# Patient Record
Sex: Female | Born: 1950
Health system: Southern US, Community
[De-identification: ages and names within clinical notes are randomized; demographics above are authoritative.]

## PROBLEM LIST (undated history)

## (undated) DIAGNOSIS — C3491 Malignant neoplasm of unspecified part of right bronchus or lung: Secondary | ICD-10-CM

## (undated) DIAGNOSIS — E559 Vitamin D deficiency, unspecified: Secondary | ICD-10-CM

## (undated) DIAGNOSIS — J449 Chronic obstructive pulmonary disease, unspecified: Secondary | ICD-10-CM

## (undated) DIAGNOSIS — R911 Solitary pulmonary nodule: Secondary | ICD-10-CM

## (undated) DIAGNOSIS — I10 Essential (primary) hypertension: Secondary | ICD-10-CM

## (undated) DIAGNOSIS — C569 Malignant neoplasm of unspecified ovary: Secondary | ICD-10-CM

## (undated) HISTORY — PX: APPENDECTOMY: SHX54

## (undated) HISTORY — DX: Vitamin D deficiency, unspecified: E55.9

## (undated) HISTORY — DX: Malignant neoplasm of unspecified ovary: C56.9

## (undated) HISTORY — DX: Solitary pulmonary nodule: R91.1

## (undated) HISTORY — PX: ABDOMINAL HYSTERECTOMY: SHX81

## (undated) HISTORY — DX: Essential (primary) hypertension: I10

## (undated) HISTORY — DX: Malignant neoplasm of unspecified part of right bronchus or lung: C34.91

## (undated) HISTORY — DX: Chronic obstructive pulmonary disease, unspecified: J44.9

---

## 1998-10-31 ENCOUNTER — Other Ambulatory Visit: Admission: RE | Admit: 1998-10-31 | Discharge: 1998-10-31 | Payer: Self-pay | Admitting: Obstetrics and Gynecology

## 1999-11-04 ENCOUNTER — Other Ambulatory Visit: Admission: RE | Admit: 1999-11-04 | Discharge: 1999-11-04 | Payer: Self-pay | Admitting: Obstetrics and Gynecology

## 2001-07-27 ENCOUNTER — Encounter: Admission: RE | Admit: 2001-07-27 | Discharge: 2001-07-27 | Payer: Self-pay | Admitting: Obstetrics and Gynecology

## 2001-07-27 ENCOUNTER — Encounter: Payer: Self-pay | Admitting: Obstetrics and Gynecology

## 2001-09-05 ENCOUNTER — Encounter: Payer: Self-pay | Admitting: Family Medicine

## 2001-09-05 ENCOUNTER — Encounter: Admission: RE | Admit: 2001-09-05 | Discharge: 2001-09-05 | Payer: Self-pay | Admitting: Family Medicine

## 2003-07-02 ENCOUNTER — Other Ambulatory Visit: Admission: RE | Admit: 2003-07-02 | Discharge: 2003-07-02 | Payer: Self-pay | Admitting: *Deleted

## 2003-08-23 ENCOUNTER — Emergency Department (HOSPITAL_COMMUNITY): Admission: EM | Admit: 2003-08-23 | Discharge: 2003-08-23 | Payer: Self-pay | Admitting: Emergency Medicine

## 2003-08-27 ENCOUNTER — Encounter: Admission: RE | Admit: 2003-08-27 | Discharge: 2003-08-27 | Payer: Self-pay | Admitting: Family Medicine

## 2003-08-28 ENCOUNTER — Encounter: Admission: RE | Admit: 2003-08-28 | Discharge: 2003-08-28 | Payer: Self-pay | Admitting: Family Medicine

## 2004-12-03 ENCOUNTER — Other Ambulatory Visit: Admission: RE | Admit: 2004-12-03 | Discharge: 2004-12-03 | Payer: Self-pay | Admitting: *Deleted

## 2004-12-23 ENCOUNTER — Encounter: Admission: RE | Admit: 2004-12-23 | Discharge: 2004-12-23 | Payer: Self-pay | Admitting: Family Medicine

## 2005-12-25 ENCOUNTER — Other Ambulatory Visit: Admission: RE | Admit: 2005-12-25 | Discharge: 2005-12-25 | Payer: Self-pay | Admitting: *Deleted

## 2006-09-07 ENCOUNTER — Encounter: Admission: RE | Admit: 2006-09-07 | Discharge: 2006-09-07 | Payer: Self-pay | Admitting: Family Medicine

## 2007-08-08 ENCOUNTER — Other Ambulatory Visit: Admission: RE | Admit: 2007-08-08 | Discharge: 2007-08-08 | Payer: Self-pay | Admitting: Family Medicine

## 2007-09-08 ENCOUNTER — Encounter: Admission: RE | Admit: 2007-09-08 | Discharge: 2007-09-08 | Payer: Self-pay | Admitting: Family Medicine

## 2008-02-17 HISTORY — PX: EXPLORATORY LAPAROTOMY: SUR591

## 2008-09-10 ENCOUNTER — Encounter: Admission: RE | Admit: 2008-09-10 | Discharge: 2008-09-10 | Payer: Self-pay | Admitting: Family Medicine

## 2008-09-16 DIAGNOSIS — C569 Malignant neoplasm of unspecified ovary: Secondary | ICD-10-CM

## 2008-09-16 HISTORY — DX: Malignant neoplasm of unspecified ovary: C56.9

## 2008-10-09 ENCOUNTER — Encounter: Admission: RE | Admit: 2008-10-09 | Discharge: 2008-10-09 | Payer: Self-pay | Admitting: Family Medicine

## 2009-10-03 ENCOUNTER — Encounter: Admission: RE | Admit: 2009-10-03 | Discharge: 2009-10-03 | Payer: Self-pay | Admitting: *Deleted

## 2009-10-17 DIAGNOSIS — J449 Chronic obstructive pulmonary disease, unspecified: Secondary | ICD-10-CM | POA: Insufficient documentation

## 2009-10-17 HISTORY — DX: Chronic obstructive pulmonary disease, unspecified: J44.9

## 2009-11-01 ENCOUNTER — Encounter: Admission: RE | Admit: 2009-11-01 | Discharge: 2009-11-01 | Payer: Self-pay | Admitting: Obstetrics and Gynecology

## 2009-11-06 ENCOUNTER — Encounter: Payer: Self-pay | Admitting: Internal Medicine

## 2009-11-20 ENCOUNTER — Encounter: Payer: Self-pay | Admitting: Internal Medicine

## 2009-11-20 DIAGNOSIS — E559 Vitamin D deficiency, unspecified: Secondary | ICD-10-CM | POA: Insufficient documentation

## 2009-11-20 DIAGNOSIS — I10 Essential (primary) hypertension: Secondary | ICD-10-CM | POA: Insufficient documentation

## 2009-11-21 ENCOUNTER — Encounter: Payer: Self-pay | Admitting: Internal Medicine

## 2009-11-21 ENCOUNTER — Ambulatory Visit: Payer: Self-pay | Admitting: Internal Medicine

## 2009-11-21 DIAGNOSIS — J984 Other disorders of lung: Secondary | ICD-10-CM | POA: Insufficient documentation

## 2010-01-29 ENCOUNTER — Ambulatory Visit: Payer: Self-pay | Admitting: Cardiology

## 2010-01-31 ENCOUNTER — Telehealth: Payer: Self-pay | Admitting: Internal Medicine

## 2010-03-03 ENCOUNTER — Ambulatory Visit: Admit: 2010-03-03 | Payer: Self-pay | Admitting: Internal Medicine

## 2010-03-06 ENCOUNTER — Ambulatory Visit
Admission: RE | Admit: 2010-03-06 | Discharge: 2010-03-06 | Payer: Self-pay | Source: Home / Self Care | Attending: Internal Medicine | Admitting: Internal Medicine

## 2010-03-18 NOTE — Assessment & Plan Note (Signed)
Summary: R CHEST LESION/ CT ///KP   Visit Type:  Initial Consult Copy to:  Dr. Seward Carol Primary Provider/Referring Provider:  Dr Lyman Bishop Nycum - OBGYN ONC [facility] [place] , Dr Joselyn Arrow - Former PMD,  Dr Lillard Anes - Former OB doc, Dr Marchelle Gearing - Pulmonary  CC:  Pulmonary Consult for abnormal CT chest.  Pt states she did have a cough earlier inthe year x 2 months. .  History of Present Illness: October  36, 5362: 60 year old female. Diagnosed in Sept 2010 with Stage 1A ovarian cancer. Doing well after s/l lap sept 2010 with tumore removal and TAH/BSO/Appy. PResented to Oncologist for surveillance CT Chest 11/01/2008.Per Dr Seward Carol records she was found to have  Incidental RUL nodule posterior segment near fissure GGO 8.13mm.C also shows some emphysema.  Presents for evaluation of same. Currently feels well - at baseline health with chronic sinus congestion. Otherwise no issues. No dyspnea. No cough.    Preventive Screening-Counseling & Management  Alcohol-Tobacco     Smoking Status: quit     Packs/Day: 0.5     Year Started: 1976     Year Quit: 2006  Current Medications (verified): 1)  Multivitamins   Tabs (Multiple Vitamin) .... Take 1 Tablet By Mouth Once A Day 2)  Fish Oil 1200 Mg Caps (Omega-3 Fatty Acids) .... Take 1 Tab By Mouth At Bedtime 3)  Vear Clock Colon Health  Caps (Probiotic Product) .... Take 1 Tab By Mouth At Bedtime 4)  Motrin Ib 200 Mg Tabs (Ibuprofen) .... As Needed 5)  Metoprolol Succinate 50 Mg Xr24h-Tab (Metoprolol Succinate) .... Take 1 Tablet By Mouth Once A Day 6)  Vitamin D 1000 Unit Tabs (Cholecalciferol) .... Take 1 Tab By Mouth At Bedtime 7)  Losartan Potassium 50 Mg Tabs (Losartan Potassium) .... Take 1 Tablet By Mouth Once A Day 8)  Vitamin C 1000 Mg Tabs (Ascorbic Acid) .... Take 1 Tablet By Mouth Once A Day 9)  Citracal Petites/vitamin D 200-250 Mg-Unit Tabs (Calcium Citrate-Vitamin D) .... Take 1 Tablet By Mouth Once A Day 10)  Zaditor  0.025 % Soln (Ketotifen Fumarate) .... One Drop in Each Eye Twice Daily  Allergies (verified): No Known Drug Allergies  Past History:  Past Medical History: HYPERTENSION (ICD-401.9)  - Was on telmisartan 2008-2011, chnaged to  losartan due to $ issus in Sept 2011  - also on lopresor * STAGE 1A OVARIAN CANCER...Marland KitchenMarland KitchenDr Seward Carol  - diagsed Sept 10, 2010 following abdominal pain and constipation. High CA 28.9. s/p TAH, BSO, Pelvic Mass, Appy  - Normal Sept 2011 per outside record VITAMIN D DEFICIENCY (ICD-268.9) # COUGH  - Jan 2011  - was severe, dry and associated post nasal drainage. 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 Started in 1969, quit in 2007 avg 7 cigs per day. Smoking Status:  quit Packs/Day:  0.5  Review of Systems       The patient complains of nasal congestion/difficulty breathing through nose.  The patient denies shortness of breath with activity, shortness of breath at rest, productive cough, non-productive cough, coughing up blood, chest pain, irregular heartbeats, acid heartburn, indigestion, loss of appetite, weight change, abdominal pain, difficulty swallowing, sore throat, tooth/dental problems, headaches, sneezing, itching, ear ache, anxiety, depression,  hand/feet swelling, joint stiffness or pain, rash, change in color of mucus, and fever.         sinus drainage - chronic spring allergies  Vital Signs:  Patient profile:   60 year old female Height:      62.5 inches Weight:      144.50 pounds BMI:     26.10 O2 Sat:      98 % on Room air Temp:     97.8 degrees F oral Pulse rate:   81 / minute BP sitting:   134 / 80  (right arm) Cuff size:   regular  Vitals Entered By: Carron Curie CMA (November 21, 2009 2:32 PM)  O2 Flow:  Room air CC: Pulmonary Consult for abnormal CT chest.  Pt states she did have a cough earlier inthe year x 2 months.  Comments Medications reviewed with patient Carron Curie CMA  November 21, 2009 2:32 PM Daytime phone number verified with patient.    Physical Exam  General:  well developed, well nourished, in no acute distress Head:  normocephalic and atraumatic Eyes:  PERRLA/EOM intact; conjunctiva and sclera clear Ears:  TMs intact and clear with normal canals Nose:  no deformity, discharge, inflammation, or lesions Mouth:  no deformity or lesions Neck:  no masses, thyromegaly, or abnormal cervical nodes Chest Wall:  no deformities noted Lungs:  clear bilaterally to auscultation and percussion Heart:  regular rate and rhythm, S1, S2 without murmurs, rubs, gallops, or clicks Abdomen:  bowel sounds positive; abdomen soft and non-tender without masses, or organomegaly Msk:  no deformity or scoliosis noted with normal posture Pulses:  pulses normal Extremities:  no clubbing, cyanosis, edema, or deformity noted Neurologic:  CN II-XII grossly intact with normal reflexes, coordination, muscle strength and tone Skin:  intact without lesions or rashes Cervical Nodes:  no significant adenopathy Axillary Nodes:  no significant adenopathy Psych:  alert and cooperative; normal mood and affect; normal attention span and concentration   CT of Chest  Procedure date:  11/01/2009  Findings:      scatterd bullous emphysema + RUL 8.77mm GGO poisterior segment  Comments:      indepently reviewed  Impression & Recommendations:  Problem # 1:  PULMONARY NODULE (ICD-518.89) Assessment New  8.54mm GGO in RUL.Low- Intermediate prob for lung cancer. But due to age, smoking hx, prior cancer and family hx of cancer needs surveillance. Instructed the rationale and told her she needs surviellance CT ches every 3-6 month for 2-3 years till it is  gone or stable. IF enlarges anytime, needs biopsy. She agrees to plan  Orders: Consultation Level V [phone]) Radiology Referral (Radiology)  Problem # 2:  C O P D (ICD-496) Assessment: New seen on CT chest but asymptomatic. Spirometry today is normal. Fev1 2.0L/83%, Ratio 81/100%.  So, Gold STAGE ZERO.   plan expectant followup  - reassured flu shot today at fu disccuss pneumovax and alpha 1 antitypsin (did not do it today, she is overwhelmed)  Medications Added to Medication List This Visit: 1)  Metoprolol Succinate 50 Mg Xr24h-tab (Metoprolol succinate) .... Take 1 tablet by mouth once a day 2)  Losartan Potassium 50 Mg Tabs (Losartan potassium) .... Take 1 tablet by mouth once a day 3)  Vitamin C 1000 Mg Tabs (Ascorbic acid) .... Take 1 tablet by mouth once a day 4)  Citracal Petites/vitamin D 200-250 Mg-unit Tabs (Calcium citrate-vitamin d) .... Take 1 tablet by mouth once a day 5)  Zaditor 0.025 %  Soln (Ketotifen fumarate) .... One drop in each eye twice daily  Other Orders: Admin 1st Vaccine (22025) Flu Vaccine 58yrs + (42706)  Patient Instructions: 1)  #PULMONARY NODULE 2)   - have followup CT chest mid dec 2011 3)  #COPD 4)   - it is only very mild and seen on CT. Your breathing test itself is normal. Currnetly we do not anticipate problems from it but you should have flu shot every year  5)  #FOLLOWUP 6)   -  mid dec 2011 after CT chest   Immunization History:  Influenza Immunization History:    Influenza:  fluvax 3+ (11/21/2007) Flu Vaccine Consent Questions     Do you have a history of severe allergic reactions to this vaccine? no    Any prior history of allergic reactions to egg and/or gelatin? no    Do you have a sensitivity to the preservative Thimersol? no    Do you have a past history of Guillan-Barre Syndrome? no    Do you currently have an acute febrile illness? no    Have you ever had a severe reaction to latex? no    Vaccine information given and  explained to patient? yes    Are you currently pregnant? no    Lot Number:[id number]   Exp Date:08/16/2010   Site Given right Deltoid IMbflu  Appended Document: R CHEST LESION/ CT ///KP fax to Dr Seward Carol and [place] [facility]  Appended Document: R CHEST LESION/ CT ///KP OV note faxed to Dr. Seward Carol and [place] .

## 2010-03-18 NOTE — Letter (Signed)
Summary: [facility]  [facility]   Imported By: Sherian Rein 11/25/2009 14:43:17  _____________________________________________________________________  External Attachment:    Type:   Image     Comment:   External Document

## 2010-03-20 NOTE — Progress Notes (Signed)
Summary: ct results  Phone Note Outgoing Call   Summary of Call: ct chest 01/29/2010 - RUL 8.55mm GGO nodule is unchanged and gave her results. Want her to come in to discuss results. At that time I will discuss options for ENB given new literature about ENB. She agrees. So, please give her appointment aftr new year. Also, please ensure [facility] gets a SUPER DIMENSON CD ROM of her CT CHEST for me. I have flaggd them Initial call taken by: Kalman Shan MD,  January 31, 2010 6:15 PM  Follow-up for Phone Call        Pt set to see MR on 03-03-10 at 4:30 for review. Carron Curie CMA  February 04, 2010 10:54 AM

## 2010-03-20 NOTE — Assessment & Plan Note (Addendum)
Summary: per pt call/mhh   Visit Type:  Follow-up Copy to:  Dr. Seward Carol Primary Provider/Referring Provider:  Dr Lyman Bishop Nycum - OBGYN ONC [facility] [place] , Dr Joselyn Arrow - Former PMD,  Dr Lillard Anes - Former OB doc, Dr Marchelle Gearing - Pulmonary  CC:  Pt here to discuss CT and next step in care. Marland Kitchen  History of Present Illness: November 29, [birth year]63: 60 year old female. Diagnosed in Sept 2010 with Stage 1A ovarian cancer. Doing well after s/l lap sept 2010 with tumore removal and TAH/BSO/Appy. PResented to Oncologist for surveillance CT Chest 11/01/2008.Per Dr Seward Carol records she was found to have  Incidental RUL nodule posterior segment near fissure GGO 8.86mm.C also shows some emphysema.  Presents for evaluation of same. Currently feels well - at baseline health with chronic sinus congestion. Otherwise no issues. No dyspnea. No cough. REC: Clinical MONITORING FOR GOLD STAGE 0 COPD AND REPEAT CT CHEST 3 MONTHS FOR PULMN NODULE   March 06, 2010: FU pulmonary nodule. No interim complaints. Asymptomatic. Had CT chest 01/29/2010 - RUL 8mm GGO is unchanged. Dr Fredirick Lathe radiologist is reocmmending  3 year followup in event this is BAC.     Preventive Screening-Counseling & Management  Alcohol-Tobacco     Smoking Status: quit     Packs/Day: 0.5     Year Started: 1976     Year Quit: 2006  Current Medications (verified): 1)  Multivitamins   Tabs (Multiple Vitamin) .... Take 1 Tablet By Mouth Once A Day 2)  Fish Oil 1200 Mg Caps (Omega-3 Fatty Acids) .... Take 1 Tab By Mouth At Bedtime 3)  Vear Clock Colon Health  Caps (Probiotic Product) .... Take 1 Tab By Mouth At Bedtime 4)  Motrin Ib 200 Mg Tabs (Ibuprofen) .... As Needed 5)  Metoprolol Succinate 50 Mg Xr24h-Tab (Metoprolol Succinate) .... Take 1 Tablet By Mouth Once A Day 6)  Vitamin D 1000 Unit Tabs (Cholecalciferol) .... Take 1 Tab By Mouth At Bedtime 7)  Losartan Potassium 50 Mg Tabs (Losartan Potassium) .... Take 1 Tablet By Mouth  Once A Day 8)  Vitamin C 1000 Mg Tabs (Ascorbic Acid) .... Take 1 Tablet By Mouth Once A Day 9)  Citracal Petites/vitamin D 200-250 Mg-Unit Tabs (Calcium Citrate-Vitamin D) .... Take 1 Tablet By Mouth Once A Day 10)  Zaditor 0.025 % Soln (Ketotifen Fumarate) .... One Drop in Each Eye Twice Daily  Allergies (verified): No Known Drug Allergies  Past History:  Past medical, surgical, family and social histories (including risk factors) reviewed, and no changes noted (except as noted below).  Past Medical History: HYPERTENSION (ICD-401.9)  - Was on telmisartan 2008-2011, chnaged to  losartan due to $ issus in Sept 2011  - also on lopresor * STAGE 1A OVARIAN CANCER...Marland KitchenMarland KitchenDr Seward Carol  - diagsed Sept 10, 2010 following abdominal pain and constipation. High CA 28.9. s/p TAH, BSO, Pelvic Mass, Appy  - Normal Sept 2011 per outside record VITAMIN D DEFICIENCY (ICD-268.9) # COUGH  - Jan 2011  - was severe, dry and associated post nasal drainage. REsolved Marc/April 2011 #CHRONIC POST NASAL DRAINAGE with SPRING ALLERGIES #RUL PULMONAR NODULE  - 8mm RUL GGO near fissure posteriorly   - no chnage in 3 months mid dec 2011    Past Surgical History: Reviewed history from 11/20/2009 and no changes required. Exploratory Laparotomy TAH/BSO and cancer staging  Family History: Reviewed history from 11/21/2009 and no changes required. Multiple Myeloma-sister deceased at age 72 Father-died suddenly age  71  Social History: Reviewed history from 11/21/2009 and no changes required. 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 Started in 1969, quit in 2007 avg 7 cigs per day.  Review of Systems  The patient denies shortness of breath with activity, shortness of breath at rest, productive cough, non-productive cough, coughing up blood, chest pain, irregular heartbeats, acid heartburn, indigestion, loss of appetite,  weight change, abdominal pain, difficulty swallowing, sore throat, tooth/dental problems, headaches, nasal congestion/difficulty breathing through nose, sneezing, itching, ear ache, anxiety, depression, hand/feet swelling, joint stiffness or pain, rash, change in color of mucus, and fever.    Vital Signs:  Patient profile:   60 year old female Height:      62.5 inches Weight:      146.38 pounds BMI:     26.44 O2 Sat:      97 % on Room air Temp:     98.1 degrees F oral Pulse rate:   62 / minute BP sitting:   120 / 84  (right arm) Cuff size:   regular  Vitals Entered By: Carron Curie CMA (March 06, 2010 3:21 PM)  O2 Flow:  Room air CC: Pt here to discuss CT and next step in care.  Comments Medications reviewed with patient Carron Curie CMA  March 06, 2010 3:23 PM Daytime phone number verified with patient.    Physical Exam  General:  well developed, well nourished, in no acute distress Head:  normocephalic and atraumatic Eyes:  PERRLA/EOM intact; conjunctiva and sclera clear Ears:  TMs intact and clear with normal canals Nose:  no deformity, discharge, inflammation, or lesions Mouth:  no deformity or lesions Neck:  no masses, thyromegaly, or abnormal cervical nodes Chest Wall:  no deformities noted Lungs:  clear bilaterally to auscultation and percussion Heart:  regular rate and rhythm, S1, S2 without murmurs, rubs, gallops, or clicks Abdomen:  bowel sounds positive; abdomen soft and non-tender without masses, or organomegaly Msk:  no deformity or scoliosis noted with normal posture Pulses:  pulses normal Extremities:  no clubbing, cyanosis, edema, or deformity noted Neurologic:  CN II-XII grossly intact with normal reflexes, coordination, muscle strength and tone Skin:  intact without lesions or rashes Cervical Nodes:  no significant adenopathy Axillary Nodes:  no significant adenopathy Psych:  alert and cooperative; normal mood and affect; normal attention  span and concentration   CT of Chest  Procedure date:  01/29/2010  Findings:      MPRESSION:  Right upper lobe ground-glass nodule appears stable from the 11/01/2009.  Continued annual follow-up, for at least 3 years, is recommended. This recommendation is taken from the following article:  Subsolid Pulmonary Nodules and the Spectrum of Peripheral Adenocarcinomas of the Lung:  Recommended Interim Guidelines for Assessment and Management.  Radiology December 2009; [phone].  Read By:  Reyes Ivan.,  M.D.     Released By:  Reyes Ivan.,  M.D.  Comments:      independely rreviewed  Impression & Recommendations:  Problem # 1:  PULMONARY NODULE (ICD-518.89) Assessment Unchanged  8.37mm GGO in RUL.Low- Intermediate prob for lung cancer. No change in 3 months mid-dec 2011. We again discussed probability for cancer and she agrees that she cannot miss followup. We discussed presence of new technology ENB Bx. She is interested in it (low risk profile for complications) if Dr. Delton Coombes feels confident that there is an airway leading to it on the SUPER  D CT. SHe understands if bx is positive for cancer then it is lobectomy but if non-diagnostic she needs serial followup but perhaps a bit more reassured that odds of malignancy wil be low.  I will get back to her afer I get Dr. Delton Coombes to reviw SUPER D CT  Orders: Consultation Level V [phone]) Radiology Referral (Radiology)  Orders: Est. Patient Level III (60454)  Patient Instructions: 1)  I am going to show your CT scan to my colleague Dr Delton Coombes and get back to you  Appended Document: per pt call/mhh DR Delton Coombes told me few weeks ago that he cannot reach that nodule with ENB. So, she needs repeat CT chest wihtout contrast in 3 months and followup. Please set this up. I called her to communicate this and gave her the result . She is ok with plan./

## 2010-04-25 ENCOUNTER — Telehealth: Payer: Self-pay | Admitting: Internal Medicine

## 2010-04-28 ENCOUNTER — Other Ambulatory Visit: Payer: Self-pay | Admitting: Internal Medicine

## 2010-04-28 DIAGNOSIS — R911 Solitary pulmonary nodule: Secondary | ICD-10-CM

## 2010-05-01 ENCOUNTER — Ambulatory Visit (INDEPENDENT_AMBULATORY_CARE_PROVIDER_SITE_OTHER)
Admission: RE | Admit: 2010-05-01 | Discharge: 2010-05-01 | Disposition: A | Discharge status: Home / Self Care | Payer: BC Managed Care – PPO | Source: Ambulatory Visit | Attending: Internal Medicine | Admitting: Internal Medicine

## 2010-05-01 DIAGNOSIS — J984 Other disorders of lung: Secondary | ICD-10-CM

## 2010-05-01 DIAGNOSIS — R911 Solitary pulmonary nodule: Secondary | ICD-10-CM

## 2010-05-05 ENCOUNTER — Telehealth: Payer: Self-pay | Admitting: Internal Medicine

## 2010-05-06 ENCOUNTER — Other Ambulatory Visit: Payer: Self-pay | Admitting: Internal Medicine

## 2010-05-06 DIAGNOSIS — R911 Solitary pulmonary nodule: Secondary | ICD-10-CM

## 2010-05-06 NOTE — Progress Notes (Signed)
Summary: needs serial CT and opd fu  Phone Note Outgoing Call   Summary of Call: DR Delton Coombes told me few weeks ago that he cannot reach that nodule with ENB. So, she needs repeat CT chest wihtout contrast in 3 months and followup. Please set this up. I called her to communicate this and gave her the result . She is ok with plan. CT order placed. Giver her appt for ct and fu with me Initial call taken by: Kalman Shan MD,  April 25, 2010 4:41 PM  Follow-up for Phone Call        pt set to see MR on 05-12-10 at 2:45. Carron Curie CMA  April 28, 2010 5:05 PM

## 2010-05-07 ENCOUNTER — Encounter: Payer: Self-pay | Admitting: Internal Medicine

## 2010-05-12 ENCOUNTER — Ambulatory Visit (INDEPENDENT_AMBULATORY_CARE_PROVIDER_SITE_OTHER): Payer: BC Managed Care – PPO | Admitting: Internal Medicine

## 2010-05-12 ENCOUNTER — Encounter: Payer: Self-pay | Admitting: Internal Medicine

## 2010-05-12 VITALS — BP 96/70 | HR 62 | Temp 98.7°F | Ht 62.0 in | Wt 148.2 lb

## 2010-05-12 DIAGNOSIS — J449 Chronic obstructive pulmonary disease, unspecified: Secondary | ICD-10-CM

## 2010-05-12 DIAGNOSIS — J984 Other disorders of lung: Secondary | ICD-10-CM

## 2010-05-12 DIAGNOSIS — R911 Solitary pulmonary nodule: Secondary | ICD-10-CM

## 2010-05-12 NOTE — Assessment & Plan Note (Signed)
  RUL 8-70mm GGO noted Sept 2011 Stable at 6 month CT chest March 2012. Asymptomatic   PLAN Next ct chest in sept 2012 to complete 1 year followup She will call for results. Plan is to manage her over phone unless there are symptoms are growth and have her come in

## 2010-05-12 NOTE — Progress Notes (Signed)
  Subjective:    Patient ID: Kristy Morrow, female    DOB: [birth year], 60 y.o.   MRN: [id number]  HPI Followup RUL pulmonary nodule. Gold stage 0 COPD and ex-smoker. No interim complaints since last visits. Feels fine. Asymptomatic. Not relapsed into smoking. Had CT chest 05/01/2010 - personally reviewed - no change in RUL nodule.   Review of Systems   Constitutional:   No  weight loss, night sweats,  Fevers, chills, fatigue, lassitude. HEENT:   No headaches,  Difficulty swallowing,  Tooth/dental problems,  Sore throat,                No sneezing, itching, ear ache, nasal congestion, post nasal drip,   CV:  No chest pain,  Orthopnea, PND, swelling in lower extremities, anasarca, dizziness, palpitations  GI  No heartburn, indigestion, abdominal pain, nausea, vomiting, diarrhea, change in bowel habits, loss of appetite  Resp: No shortness of breath with exertion or at rest.  No excess mucus, no productive cough,  No non-productive cough,  No coughing up of blood.  No change in color of mucus.  No wheezing.  No chest wall deformity  Skin: no rash or lesions.  GU: no dysuria, change in color of urine, no urgency or frequency.  No flank pain.  MS:  No joint pain or swelling.  No decreased range of motion.  No back pain.  Psych:  No change in mood or affect. No depression or anxiety.  No memory loss.    Objective:   Physical Exam  Vitals reviewed. Constitutional: She is oriented to person, place, and time. She appears well-developed and well-nourished. No distress.  HENT:  Head: Normocephalic and atraumatic.  Right Ear: External ear normal.  Left Ear: External ear normal.  Mouth/Throat: Oropharynx is clear and moist. No oropharyngeal exudate.  Eyes: Conjunctivae and EOM are normal. Pupils are equal, round, and reactive to light. Right eye exhibits no discharge. Left eye exhibits no discharge. No scleral icterus.  Neck: Normal range of motion. Neck supple. No JVD present. No tracheal  deviation present. No thyromegaly present.  Cardiovascular: Normal rate, regular rhythm, normal heart sounds and intact distal pulses.  Exam reveals no gallop and no friction rub.   No murmur heard. Pulmonary/Chest: Effort normal and breath sounds normal. No respiratory distress. She has no wheezes. She has no rales. She exhibits no tenderness.  Abdominal: Soft. Bowel sounds are normal. She exhibits no distension and no mass. There is no tenderness. There is no rebound and no guarding.  Musculoskeletal: Normal range of motion. She exhibits no edema and no tenderness.  Lymphadenopathy:    She has no cervical adenopathy.  Neurological: She is alert and oriented to person, place, and time. She has normal reflexes. No cranial nerve deficit. She exhibits normal muscle tone. Coordination normal.  Skin: Skin is warm and dry. No rash noted. She is not diaphoretic. No erythema. No pallor.  Psychiatric: She has a normal mood and affect. Her behavior is normal. Judgment and thought content normal.           Assessment & Plan:

## 2010-05-12 NOTE — Assessment & Plan Note (Signed)
Plan Check alpha 1 at some point in future

## 2010-05-12 NOTE — Patient Instructions (Signed)
Glad your lung nodule Right upper part is stable now at 6 months NExt CT chest in sept 2012 After that CT chest, please call us for result - if you are fine at that time without complaints we can discuss results and followup over phone without having to come in

## 2010-05-20 NOTE — Progress Notes (Signed)
Summary: ct chest results and fu  Phone Note Outgoing Call   Summary of Call: ct shows stable nodule in 6 months. IF she wants to come in to discuss she can do so on 05/12/2010. Otherwise, repeat CT chest wihtout contrast 9 months but definitely see me then. ORder placed Initial call taken by: Kalman Shan MD,  May 05, 2010 9:41 PM  Follow-up for Phone Call        pt cam e for appt on 05-12-10.Carron Curie CMA  May 12, 2010 2:50 PM

## 2010-11-05 ENCOUNTER — Other Ambulatory Visit: Payer: BC Managed Care – PPO

## 2010-11-07 ENCOUNTER — Other Ambulatory Visit: Payer: Self-pay | Admitting: Family Medicine

## 2010-11-07 DIAGNOSIS — Z1231 Encounter for screening mammogram for malignant neoplasm of breast: Secondary | ICD-10-CM

## 2010-11-10 ENCOUNTER — Ambulatory Visit
Admission: RE | Admit: 2010-11-10 | Discharge: 2010-11-10 | Disposition: A | Discharge status: Home / Self Care | Payer: BC Managed Care – PPO | Source: Ambulatory Visit | Attending: Family Medicine | Admitting: Family Medicine

## 2010-11-10 DIAGNOSIS — Z1231 Encounter for screening mammogram for malignant neoplasm of breast: Secondary | ICD-10-CM

## 2010-11-11 ENCOUNTER — Other Ambulatory Visit: Payer: Self-pay | Admitting: Obstetrics and Gynecology

## 2010-11-11 DIAGNOSIS — C569 Malignant neoplasm of unspecified ovary: Secondary | ICD-10-CM

## 2010-12-02 ENCOUNTER — Ambulatory Visit
Admission: RE | Admit: 2010-12-02 | Discharge: 2010-12-02 | Disposition: A | Discharge status: Home / Self Care | Payer: BC Managed Care – PPO | Source: Ambulatory Visit | Attending: Obstetrics and Gynecology | Admitting: Obstetrics and Gynecology

## 2010-12-02 DIAGNOSIS — C569 Malignant neoplasm of unspecified ovary: Secondary | ICD-10-CM

## 2010-12-02 MED ORDER — IOHEXOL 300 MG/ML  SOLN: 100.0000 mL | Freq: Once | INTRAMUSCULAR | Status: AC | PRN

## 2010-12-08 ENCOUNTER — Telehealth: Payer: Self-pay | Admitting: Internal Medicine

## 2010-12-08 NOTE — Telephone Encounter (Signed)
LMOM for pt TCB to get more info.   

## 2010-12-08 NOTE — Telephone Encounter (Signed)
Pt returned call.  States she has a CT done on 12/02/10 at [facility].  States she was told to call for the results by MR at last OV.  MR, pls advise.  Thanks!

## 2010-12-11 NOTE — Telephone Encounter (Signed)
Called and spoke with pt.  Pt aware of CT results and MR's response.Pt verbalized understanding and denied any questions.    Pt will call back in approx 5 months to schedule her 6 month CT scan and also OV with MR.

## 2010-12-11 NOTE — Telephone Encounter (Signed)
Ct chest 12/02/10: unchanged from march 2012. So no change in 1 year since sept 2011. Need another CT chest without contrast in 6 months. But at that time I should see her. She also had abd ct 12/02/10 - not sure if I ordered it but they are reporting that is fine too

## 2010-12-11 NOTE — Telephone Encounter (Signed)
Please advise. Thanks.  

## 2011-12-03 ENCOUNTER — Other Ambulatory Visit: Payer: Self-pay | Admitting: Family Medicine

## 2011-12-03 DIAGNOSIS — Z1231 Encounter for screening mammogram for malignant neoplasm of breast: Secondary | ICD-10-CM

## 2012-01-05 ENCOUNTER — Telehealth: Payer: Self-pay | Admitting: Internal Medicine

## 2012-01-05 ENCOUNTER — Other Ambulatory Visit: Payer: Self-pay | Admitting: Internal Medicine

## 2012-01-05 DIAGNOSIS — R911 Solitary pulmonary nodule: Secondary | ICD-10-CM

## 2012-01-05 NOTE — Telephone Encounter (Signed)
Pt states she wants the CT chest ordered but could not schedule an appt at this time because she was driving. She requested that we call her tomorrow and schedule both appts. Order placed. Carron Curie, CMA

## 2012-01-05 NOTE — Telephone Encounter (Signed)
She did not followup in spring of this year for 6 month CT chest from oct 2012. She needs to have a CT now which will be 1 year. Pleae check with her wihat is going on

## 2012-01-06 ENCOUNTER — Ambulatory Visit
Admission: RE | Admit: 2012-01-06 | Discharge: 2012-01-06 | Disposition: A | Discharge status: Home / Self Care | Payer: BC Managed Care – PPO | Source: Ambulatory Visit | Attending: Family Medicine | Admitting: Family Medicine

## 2012-01-06 DIAGNOSIS — Z1231 Encounter for screening mammogram for malignant neoplasm of breast: Secondary | ICD-10-CM

## 2012-01-08 ENCOUNTER — Ambulatory Visit (INDEPENDENT_AMBULATORY_CARE_PROVIDER_SITE_OTHER)
Admission: RE | Admit: 2012-01-08 | Discharge: 2012-01-08 | Disposition: A | Discharge status: Home / Self Care | Payer: BC Managed Care – PPO | Source: Ambulatory Visit | Attending: Internal Medicine | Admitting: Internal Medicine

## 2012-01-08 DIAGNOSIS — R911 Solitary pulmonary nodule: Secondary | ICD-10-CM

## 2012-01-11 ENCOUNTER — Telehealth: Payer: Self-pay | Admitting: Internal Medicine

## 2012-01-11 NOTE — Telephone Encounter (Signed)
Called gave ct results. expalined nodule is more prominent but unchangd in size. Likely approach is serial monitoring. Will discuss other options at fu 02/03/12

## 2012-02-03 ENCOUNTER — Telehealth: Payer: Self-pay | Admitting: Internal Medicine

## 2012-02-03 ENCOUNTER — Encounter: Payer: Self-pay | Admitting: Internal Medicine

## 2012-02-03 ENCOUNTER — Ambulatory Visit (INDEPENDENT_AMBULATORY_CARE_PROVIDER_SITE_OTHER): Payer: BC Managed Care – PPO | Admitting: Internal Medicine

## 2012-02-03 VITALS — BP 124/84 | HR 59 | Temp 98.0°F | Wt 151.0 lb

## 2012-02-03 DIAGNOSIS — R911 Solitary pulmonary nodule: Secondary | ICD-10-CM

## 2012-02-03 DIAGNOSIS — J984 Other disorders of lung: Secondary | ICD-10-CM

## 2012-02-03 NOTE — Assessment & Plan Note (Signed)
RUL 8-63mm GGO noted Sept 2011 Stable at 6 month CT chest March 2012. Asymptomatic. Not a candidate for ENB - no airway leading up to it Stable 12 month Oct 2012 Unchanged in size but more prominent Nov 2013 - 26th month CT  Because nodule is more prominent in appearance thought unchanged in size at 2 year mark, we agreed she needs continue surveillance. WIll repeat ct chest in 9-10 months. We diuscussed getting PET scan now or doing XPRESYS blood test but she decided against it  > 50% of this 15 min visit spent in face to face counseling

## 2012-02-03 NOTE — Progress Notes (Signed)
  Subjective:    Patient ID: Kristy Morrow, female    DOB: 1950-02-26, 61 y.o.   MRN: [id number]  HPI Followup RUL pulmonary nodule. Gold stage 0 COPD and ex-smoker. No interim complaints since last visits. Feels fine. Asymptomatic. Not relapsed into smoking. Had CT chest 05/01/2010 - personally reviewed - no change in RUL nodule.  REC Glad your lung nodule Right upper part is stable now at 6 months  NExt CT chest in sept 2012  After that CT chest, please call us for result - if you are fine at that time without complaints we can discuss results and followup over phone without having to come in   OV 02/03/2012  FU CT chest for rUL nodule 1cm  -Last CT ws OCt 2012 and was stable compared to sept 2011. She then missed appt and we had to call her and have her do 2 year nov 2013 ct chest reviewed below which shows nodule as more "prominent" compared to 2 years ago but unchanged in size. She has no interim complaints. Feels well  CT chest nov 2013IMPRESSION:  1. Numerous previously noted subsolid nodules in the lungs appear  generally similar to prior examinations, as above. The exception is  a 11 x 7 mm subsolid nodule in the posterior aspect of the right  upper lobe (image 18 of series 3) more prominent since sept 2011. Continued attention to this  lesion (and the others) on a 1-year follow-up CT in November 2014  is recommended.      Review of Systems  Constitutional: Negative for fever and unexpected weight change.  HENT: Negative for ear pain, nosebleeds, congestion, sore throat, rhinorrhea, sneezing, trouble swallowing, dental problem, postnasal drip and sinus pressure.   Eyes: Negative for redness and itching.  Respiratory: Negative for cough, chest tightness, shortness of breath and wheezing.   Cardiovascular: Negative for palpitations and leg swelling.  Gastrointestinal: Negative for nausea and vomiting.  Genitourinary: Negative for dysuria.  Musculoskeletal: Negative for joint  swelling.  Skin: Negative for rash.  Neurological: Negative for headaches.  Hematological: Does not bruise/bleed easily.  Psychiatric/Behavioral: Negative for dysphoric mood. The patient is not nervous/anxious.    Past, Family, Social reviewed: no change since last visit     Objective:   Physical Exam Filed Vitals:   02/03/12 1449  BP: 124/84  Pulse: 59  Temp: 98 F (36.7 C)  TempSrc: Oral  Weight: 151 lb (68.493 kg)  SpO2: 100%     Constitutional: She is oriented to person, place, and time. She appears well-developed and well-nourished. No distress.  Neurological: She is alert and oriented to person, place, and time. She has normal reflexes. No cranial nerve deficit. She exhibits normal muscle tone. Coordination normal.   Psychiatric: She has a normal mood and affect. Her behavior is normal. Judgment and thought content normal.           Assessment & Plan:

## 2012-02-03 NOTE — Telephone Encounter (Signed)
Kristy Morrow  On instructions is her OB doc name & fax. Please fax my office note to him  Thanks  MR

## 2012-02-03 NOTE — Patient Instructions (Addendum)
#  Lung nodule  - stable in size sept 2011 through nov 2013 but nov 2013 shows slight more "prominence" - unclear what this means but needs closer followup  - please have repeat CT chest without contrast in 9-10 months from now and return for followup   - I wil send copy of note and CT to Dr Elita Boone, [address] [place], [place] [address]. Fax [phone], phone [phone]  [facility]

## 2012-02-05 NOTE — Telephone Encounter (Signed)
I will fax to [phone] on Monday. Carron Curie, CMA

## 2012-02-09 NOTE — Telephone Encounter (Signed)
Note faxed. Gadge Hermiz, CMA  

## 2012-04-11 ENCOUNTER — Other Ambulatory Visit: Payer: Self-pay | Admitting: Family Medicine

## 2012-04-11 DIAGNOSIS — M8580 Other specified disorders of bone density and structure, unspecified site: Secondary | ICD-10-CM

## 2012-04-11 DIAGNOSIS — E2839 Other primary ovarian failure: Secondary | ICD-10-CM

## 2012-04-11 DIAGNOSIS — Z1231 Encounter for screening mammogram for malignant neoplasm of breast: Secondary | ICD-10-CM

## 2012-06-08 ENCOUNTER — Other Ambulatory Visit: Payer: Self-pay | Admitting: Obstetrics and Gynecology

## 2012-06-08 DIAGNOSIS — C569 Malignant neoplasm of unspecified ovary: Secondary | ICD-10-CM

## 2012-06-10 ENCOUNTER — Other Ambulatory Visit: Payer: BC Managed Care – PPO

## 2012-06-14 ENCOUNTER — Ambulatory Visit
Admission: RE | Admit: 2012-06-14 | Discharge: 2012-06-14 | Disposition: A | Discharge status: Home / Self Care | Payer: BC Managed Care – PPO | Source: Ambulatory Visit | Attending: Obstetrics and Gynecology | Admitting: Obstetrics and Gynecology

## 2012-06-14 DIAGNOSIS — C569 Malignant neoplasm of unspecified ovary: Secondary | ICD-10-CM

## 2012-06-14 MED ORDER — IOHEXOL 300 MG/ML  SOLN
100.0000 mL | Freq: Once | INTRAMUSCULAR | Status: AC | PRN
  Administered 2012-06-14: 100 mL via INTRAVENOUS

## 2012-07-10 ENCOUNTER — Telehealth: Payer: Self-pay | Admitting: Internal Medicine

## 2012-07-10 NOTE — Telephone Encounter (Signed)
Please ensure she has CT chest wo contrast for lung nodule around sept/oct 2014   Thanks  Dr. Kalman Shan, M.D., [facility].C.P Pulmonary and Critical Care Medicine Staff Physician [facility] [place] [facility] Pager: [phone], If no answer or between  15:00h - 7:00h: call [phone]  07/10/2012 1:46 AM

## 2012-07-21 NOTE — Telephone Encounter (Signed)
Order is placed for CT in sept/oct 2014. Carron Curie, CMA

## 2012-12-15 ENCOUNTER — Ambulatory Visit (INDEPENDENT_AMBULATORY_CARE_PROVIDER_SITE_OTHER)
Admission: RE | Admit: 2012-12-15 | Discharge: 2012-12-15 | Disposition: A | Discharge status: Home / Self Care | Payer: BC Managed Care – PPO | Source: Ambulatory Visit | Attending: Internal Medicine | Admitting: Internal Medicine

## 2012-12-15 DIAGNOSIS — R911 Solitary pulmonary nodule: Secondary | ICD-10-CM

## 2012-12-16 ENCOUNTER — Telehealth: Payer: Self-pay | Admitting: Internal Medicine

## 2012-12-16 DIAGNOSIS — J984 Other disorders of lung: Secondary | ICD-10-CM

## 2012-12-16 NOTE — Telephone Encounter (Signed)
  Sending to triage because this needs attention today regarding Chest CT report (cc To Dr Delton Coombes)  1) LEt jher know that compared to a year ago that RUL nodule is slightly larger to same size but seems definitely denser. In other words, it is looking different than it did a year ago. We need to discuss a) what this means; b) how to proceed with workup ?  So,   A) please call Rose at [facility] CT today and make sure SUPER D CD Rom is made ASAP  B) Due to my absence, let her know that you will give appt to see Dr Delton Coombes who is our nodule expert to guide her through the process. Please give appt to see him asa; ensure he has the super D CT  Thanks  Dr. Kalman Shan, M.D., [facility].C.P Pulmonary and Critical Care Medicine Staff Physician [facility] [place] [facility]monary and Critical [facility] Pager: [phone], If no answer or between  15:00h - 7:00h: call [phone]  12/16/2012 3:35 PM      Ct Chest Wo Contrast  12/15/2012   CLINICAL DATA:  Routine followup pulmonary nodule. No new symptoms.  EXAM: CT CHEST WITHOUT CONTRAST  TECHNIQUE: Multidetector CT imaging of the chest was performed following the standard protocol without IV contrast.  COMPARISON:  01/08/2012  FINDINGS: The sub solid nodule is noted in the posterior right upper lobe abutting the major fissure appears larger and more dense on today's study. Best estimation of size is approximately 1.5 x 1.3 cm on image 16 of series 3. On the soft tissue windows, there does appear to be a solid component measuring approximately 5-6 mm. Given the change in the appearance of this nodule, would recommend biopsy or surgical resection.  4 mm ground-glass area within the right apex is stable. Ill-defined ground-glass density in the left upper lobe on image 14 measures approximately 11 mm. When measured at the same level in the same planes, this is stable. No new suspicious areas. No pleural effusions.  No mediastinal, hilar, or axillary  adenopathy. Heart is normal size. Aorta is normal caliber. Chest wall soft tissues are unremarkable. Imaging into the upper abdomen shows no acute findings. No acute bony abnormality. Small sclerotic focus within a lower thoracic vertebral body is stable, likely small bone island.  IMPRESSION: Change in the appearance of the previously seen sub solid ground-glass nodule in the posterior right upper lobe. This has enlarged slightly in size and appears denser. There is approximately 5-6 mm solid component centrally seen on soft tissue windows. Given this change and the small solid components, recommend biopsy or surgical resection. With this appearance, PET CT would likely be equivocal.   Electronically Signed   By: Charlett Nose M.D.   On: 12/15/2012 16:11

## 2012-12-16 NOTE — Telephone Encounter (Signed)
Pt scheduled for 12/10 appt with Byrum--consult pulm nodule PET to be scheduled before appt.  Order placed.   Will send to [facility] to follow up on so that RB nurse is aware that PET gets done in time for appt. Thanks.

## 2012-12-16 NOTE — Telephone Encounter (Signed)
Spoke with Rose--CT SUPER D requested--requested to be delivered to [facility] Pt aware of results and recs per MR.  Patient to see Dr Delton Coombes (per MR)-- pt offered appt for Monday 12/19/12 at 4:15/4:30-- pt refused d/t being out of town, does not get home until 12/20/12 Pt states that she will only be in town until 12/30/12, then going out of town again x 10days  Pt states she needs appt btw 11/10-11/14-- no available slots for either Byrum or Ramaswamy in that time period.  Patient would like to know if this is something that you feel could wait until the last week of this month or first week of December.  Please advise MR. thanks.

## 2012-12-19 NOTE — Telephone Encounter (Signed)
Pet scheduled 01/17/13 Tobe Sos

## 2013-01-10 ENCOUNTER — Ambulatory Visit: Payer: BC Managed Care – PPO

## 2013-01-10 ENCOUNTER — Ambulatory Visit
Admission: RE | Admit: 2013-01-10 | Discharge: 2013-01-10 | Disposition: A | Discharge status: Home / Self Care | Payer: BC Managed Care – PPO | Source: Ambulatory Visit | Attending: Family Medicine | Admitting: Family Medicine

## 2013-01-10 DIAGNOSIS — Z1231 Encounter for screening mammogram for malignant neoplasm of breast: Secondary | ICD-10-CM

## 2013-01-10 DIAGNOSIS — E2839 Other primary ovarian failure: Secondary | ICD-10-CM

## 2013-01-17 ENCOUNTER — Ambulatory Visit (HOSPITAL_COMMUNITY)
Admission: RE | Admit: 2013-01-17 | Discharge: 2013-01-17 | Disposition: A | Discharge status: Home / Self Care | Payer: BC Managed Care – PPO | Source: Ambulatory Visit | Attending: Internal Medicine | Admitting: Internal Medicine

## 2013-01-17 DIAGNOSIS — J984 Other disorders of lung: Secondary | ICD-10-CM

## 2013-01-17 DIAGNOSIS — R911 Solitary pulmonary nodule: Secondary | ICD-10-CM | POA: Insufficient documentation

## 2013-01-17 LAB — GLUCOSE, CAPILLARY: Glucose-Capillary: 98 mg/dL (ref 70–99)

## 2013-01-17 MED ORDER — FLUDEOXYGLUCOSE F - 18 (FDG) INJECTION
17.6000 | Freq: Once | INTRAVENOUS | Status: AC | PRN
  Administered 2013-01-17: 17.6 via INTRAVENOUS

## 2013-01-19 ENCOUNTER — Telehealth: Payer: Self-pay | Admitting: *Deleted

## 2013-01-19 ENCOUNTER — Encounter: Payer: Self-pay | Admitting: Emergency Medicine

## 2013-01-19 NOTE — Telephone Encounter (Signed)
I have the superD disk, will hold onto it.

## 2013-01-19 NOTE — Telephone Encounter (Signed)
Message copied by Darrell Jewel on Thu Jan 19, 2013 12:04 PM ------      Message from: Kalman Shan      Created: Tue Jan 17, 2013  4:47 PM       byrum to discuss result 01/25/13. ENsure there is Super D from prior CT. I Think we got one. IF there is, give to [facility] before or on 01/25/13 ------

## 2013-01-19 NOTE — Telephone Encounter (Signed)
Jennifer,  Check with ROb Byrum, I have copied him on this. Might have lefte earlier tepehone message that super D should go directly to him  Thanks  Dr. Kalman Shan, M.D., [facility].C.P Pulmonary and Critical Care Medicine Staff Physician [facility] [place] Pulmonary and Critical Care Pager: [phone], If no answer or between  15:00h - 7:00h: call [phone]  01/19/2013 3:18 PM

## 2013-01-19 NOTE — Telephone Encounter (Signed)
I called rose and they did a super D ct on 12-16-12 and  Sent it over. I looked through your office and I cannot find it. Do you know where it would be? Carron Curie, CMA

## 2013-01-25 ENCOUNTER — Encounter (INDEPENDENT_AMBULATORY_CARE_PROVIDER_SITE_OTHER): Payer: Self-pay

## 2013-01-25 ENCOUNTER — Encounter: Payer: Self-pay | Admitting: Emergency Medicine

## 2013-01-25 ENCOUNTER — Ambulatory Visit (INDEPENDENT_AMBULATORY_CARE_PROVIDER_SITE_OTHER): Payer: BC Managed Care – PPO | Admitting: Emergency Medicine

## 2013-01-25 VITALS — BP 130/78 | HR 80 | Ht 62.0 in | Wt 145.5 lb

## 2013-01-25 DIAGNOSIS — J984 Other disorders of lung: Secondary | ICD-10-CM

## 2013-01-25 NOTE — Assessment & Plan Note (Signed)
Given the size and shape of nodule, its growth, and PET negative for mets, I believe that she is a good candidate for primary resection. I have recommended this and will refer her to TCTS to discuss the procedure. If she has hesitation she can always come back to me to discuss possible FOB + ENB.

## 2013-01-25 NOTE — Progress Notes (Signed)
   Subjective:    Patient ID: Kristy Morrow, female    DOB: 06-16-1950, 62 y.o.   MRN: [id number]  HPI 62 yo woman with hx of ovarian CA,  mild COPD, RUL nodule that has been followed. The nodule was increased in size by CT scan 12/15/12, was not PET hot on scan from 01/19/13 but is small and may not meet size sensitivity criteria.    Review of Systems  Constitutional: Negative for fever and unexpected weight change.  HENT: Positive for sinus pressure and sneezing. Negative for congestion, dental problem, ear pain, nosebleeds, postnasal drip, rhinorrhea, sore throat and trouble swallowing.   Eyes: Negative for redness and itching.  Respiratory: Negative for cough, chest tightness, shortness of breath and wheezing.   Cardiovascular: Negative for palpitations and leg swelling.  Gastrointestinal: Negative for nausea and vomiting.  Genitourinary: Negative for dysuria.  Musculoskeletal: Negative for joint swelling.  Skin: Negative for rash.  Neurological: Negative for headaches.  Hematological: Does not bruise/bleed easily.  Psychiatric/Behavioral: Negative for dysphoric mood. The patient is not nervous/anxious.        Objective:   Physical Exam Filed Vitals:   01/25/13 1057  BP: 130/78  Pulse: 80  Height: 5\' 2"  (1.575 m)  Weight: 145 lb 8 oz (65.998 kg)  SpO2: 100%   Gen: Pleasant, well-nourished, in no distress,  normal affect  ENT: No lesions,  mouth clear,  oropharynx clear, no postnasal drip  Neck: No JVD, no TMG, no carotid bruits  Lungs: No use of accessory muscles, clear without rales or rhonchi  Cardiovascular: RRR, heart sounds normal, no murmur or gallops, no peripheral edema  Musculoskeletal: No deformities, no cyanosis or clubbing  Neuro: alert, non focal  Skin: Warm, no lesions or rashes     Assessment & Plan:  PULMONARY NODULE Given the size and shape of nodule, its growth, and PET negative for mets, I believe that she is a good candidate for primary  resection. I have recommended this and will refer her to TCTS to discuss the procedure. If she has hesitation she can always come back to me to discuss possible FOB + ENB.

## 2013-01-25 NOTE — Patient Instructions (Signed)
We will refer you to the thoracic surgery to discuss possible resection of your right upper lobe nodule Depending on these discussions we could still consider biopsy by bronchoscopy if this is the preferred option Follow with Dr. Delton Coombes or Marchelle Gearing as needed

## 2013-01-31 ENCOUNTER — Ambulatory Visit (INDEPENDENT_AMBULATORY_CARE_PROVIDER_SITE_OTHER): Payer: BC Managed Care – PPO | Admitting: Thoracic Surgery (Cardiothoracic Vascular Surgery)

## 2013-01-31 ENCOUNTER — Encounter: Payer: Self-pay | Admitting: Thoracic Surgery (Cardiothoracic Vascular Surgery)

## 2013-01-31 ENCOUNTER — Other Ambulatory Visit: Payer: Self-pay | Admitting: *Deleted

## 2013-01-31 VITALS — BP 115/76 | HR 64 | Resp 18 | Ht 62.0 in | Wt 145.0 lb

## 2013-01-31 DIAGNOSIS — R911 Solitary pulmonary nodule: Secondary | ICD-10-CM

## 2013-01-31 NOTE — Progress Notes (Signed)
PCP is FULP, CAMMIE, MD Referring Provider is Fulp, Cammie, MD  Chief Complaint  Patient presents with  . Lung Lesion    Surgical eval on RUL nodule, Chest CT 12/15/12, PET Scan 01/17/2013    HPI: Ms. Kristy Morrow is a 62-year-old woman with a past history of light tobacco abuse and ovarian cancer. In 2011 she had a CT of the chest as part of her followup for ovarian cancer. She was noted to have a groundglass opacity in the right upper lobe. She was referred to pulmonary and has been followed for this ever since. In October she had a CT which showed the lesion to be larger and also more solid in appearance. A PET CT was done earlier this month. The lesion was not hypermetabolic.  She states that she smoked one half a pack of cigarettes daily for 30 years until she quit in 2006. She denies any chest pain, pressure, or tightness. She denies shortness of breath. She denies cough, hemoptysis, and wheezing. Her weight has been stable. Her appetite is good. Overall she feels well.  ECOG/ZUBROD =0   Past Medical History  Diagnosis Date  . Unspecified essential hypertension   . Unspecified vitamin D deficiency   . Allergic rhinitis   . Pulmonary nodule, right     RUL 8-9mm GGO noted Sept 2011. Stable march 2012  . COPD (chronic obstructive pulmonary disease) sept 2011    seen on CT. Gold stage 0 on PFT Oct 2011  . Ovarian cancer aug 2010    s/p TAH BSO. Dr Nycum. Stage 1A low grade cystadenocarinoma    Past Surgical History  Procedure Laterality Date  . Exploratory laparotomy      Family History  Problem Relation Age of Onset  . Multiple myeloma Sister     Social History History  Substance Use Topics  . Smoking status: Former Smoker -- 0.50 packs/day for 30 years    Types: Cigarettes    Quit date: 02/17/2004  . Smokeless tobacco: Not on file  . Alcohol Use: Not on file    Current Outpatient Prescriptions  Medication Sig Dispense Refill  . Cholecalciferol (VITAMIN D) 1000 UNITS  capsule Take 1,000 Units by mouth daily.        . losartan (COZAAR) 50 MG tablet Take 50 mg by mouth daily.        . metoprolol (TOPROL-XL) 50 MG 24 hr tablet Take 1 tablet by mouth Daily.      . Multiple Vitamin (MULTIVITAMIN) capsule Take 1 capsule by mouth daily.         No current facility-administered medications for this visit.    No Known Allergies  Review of Systems  Constitutional: Negative for fever, chills, diaphoresis, activity change, appetite change, fatigue and unexpected weight change.  Eyes:       Wears glasses, floaters  Respiratory: Negative for cough, shortness of breath and wheezing.   Cardiovascular: Negative for chest pain and leg swelling.  Neurological: Negative for weakness and headaches.  Hematological: Does not bruise/bleed easily.  All other systems reviewed and are negative.    BP 115/76  Pulse 64  Resp 18  Ht 5' 2" (1.575 m)  Wt 145 lb (65.772 kg)  BMI 26.51 kg/m2  SpO2 98% Physical Exam  Vitals reviewed. Constitutional: She is oriented to person, place, and time. She appears well-developed and well-nourished. No distress.  HENT:  Head: Normocephalic and atraumatic.  Eyes: EOM are normal. Pupils are equal, round, and reactive to light.    Neck: Neck supple. No thyromegaly present.  Cardiovascular: Normal rate, regular rhythm, normal heart sounds and intact distal pulses.  Exam reveals no gallop and no friction rub.   No murmur heard. Pulmonary/Chest: Effort normal. She has no wheezes. She has no rales.  Abdominal: Soft. There is no tenderness.  Musculoskeletal: She exhibits no edema.  Lymphadenopathy:    She has no cervical adenopathy.  Neurological: She is alert and oriented to person, place, and time. No cranial nerve deficit.  No focal motor deficit  Skin: Skin is warm and dry.     Diagnostic Tests: CT of chest 12/15/2012 COMPARISON: 01/08/2012  FINDINGS:  The sub solid nodule is noted in the posterior right upper lobe  abutting  the major fissure appears larger and more dense on today's  study. Best estimation of size is approximately 1.5 x 1.3 cm on  image 16 of series 3. On the soft tissue windows, there does appear  to be a solid component measuring approximately 5-6 mm. Given the  change in the appearance of this nodule, would recommend biopsy or  surgical resection.  4 mm ground-glass area within the right apex is stable. Ill-defined  ground-glass density in the left upper lobe on image 14 measures  approximately 11 mm. When measured at the same level in the same  planes, this is stable. No new suspicious areas. No pleural  effusions.  No mediastinal, hilar, or axillary adenopathy. Heart is normal size.  Aorta is normal caliber. Chest wall soft tissues are unremarkable.  Imaging into the upper abdomen shows no acute findings. No acute  bony abnormality. Small sclerotic focus within a lower thoracic  vertebral body is stable, likely small bone island.  IMPRESSION:  Change in the appearance of the previously seen sub solid  ground-glass nodule in the posterior right upper lobe. This has  enlarged slightly in size and appears denser. There is approximately  5-6 mm solid component centrally seen on soft tissue windows. Given  this change and the small solid components, recommend biopsy or  surgical resection. With this appearance, PET CT would likely be  equivocal.  Electronically Signed  By: Kevin Dover M.D.  On: 12/15/2012 16:11  PET/CT 01/17/2013 NUCLEAR MEDICINE PET SKULL BASE TO THIGH  FASTING BLOOD GLUCOSE: Value: 98mg/dl  TECHNIQUE:  17.6 mCi F-18 FDG was injected intravenously. CT data was obtained  and used for attenuation correction and anatomic localization only.  (This was not acquired as a diagnostic CT examination.) Additional  exam technical data entered on technologist worksheet.  COMPARISON: Chest CT 12/15/2012.  FINDINGS:  NECK  No hypermetabolic lymph nodes in the neck.  CHEST   The previously noted sub solid nodule in the posterior aspect of the  right upper lobe currently measures approximately 14 x 12 mm (image  71 of series 2), with a central solid component estimated to measure  approximately 9 mm on today's examination. This lesion does not  demonstrate definite hypermetabolism (SUVmax = 1.9). Previously  described ground-glass attenuation nodules are otherwise not well  visualized on today's non breath-held CT examination. No  hypermetabolic mediastinal or hilar nodes.  ABDOMEN/PELVIS  No abnormal hypermetabolic activity within the liver, pancreas,  adrenal glands, or spleen. No hypermetabolic lymph nodes in the  abdomen or pelvis. Atherosclerosis throughout the abdominal and  pelvic vasculature, without definite aneurysm. No significant volume  of ascites, no pneumoperitoneum no pathologic distention small  status post hysterectomy ovaries are not confidently identified may  be surgically absent atrophic.    SKELETON  No focal hypermetabolic activity to suggest skeletal metastasis.  IMPRESSION:  1. Previously described subsolid nodule the posterior aspect of the  right upper lobe demonstrates no convincing hypermetabolism on  today's examination. However, the persistence of this nodule, and  the developing central solid component of the nodule are concerning  for slow-growing neoplasm such as adenocarcinoma, which can  demonstrate only low level metabolism on PET imaging. Accordingly,  further evaluation with biopsy or surgical resection may warrant  further consideration.  Electronically Signed  By: Daniel Entrikin M.D.  On: 01/17/2013 10:33  Impression: 62-year-old woman with a past history of tobacco abuse who has a right upper lobe nodule. This was first discovered in 2011. At that time it was a pure groundglass opacity. Over time this is increased in size and now has a soft tissue component as well. This is highly suspicious for a low-grade  adenocarcinoma, possibly an adenocarcinoma with lepidic growth. The negative PET does not rule out this possibility.  I reviewed the CT and PET scan with Ms. Poitra. We discussed the differential diagnosis which includes cancer, as well as infectious or inflammatory etiologies. We discussed the option of biopsy, either CT-guided or bronchoscopic. We did discuss that a negative biopsy would not rule out cancer, and given the progression of the nodule on CT I would favor excisional biopsy for definitive diagnosis no matter what.  I recommended to her that we proceed with right VATS, wedge resection, possible right upper lobectomy. We discussed the general nature of the operation, the need for general anesthesia, incisions to be used, and the intraoperative decision making. We discussed the indications, risks, benefits, and alternatives. She understands the risks include, but are not limited to death, MI, DVT, PE, bleeding, possible need for transfusion, infection, prolonged air leak, cardiac arrhythmias, as well as the possibility of unforeseeable complications. She is a low risk surgical candidate. We also discussed the expected hospital stay and overall recovery.  She accepts the risk and wishes to proceed with surgery.  She does need pulmonary function testing prior surgery, although I do not expect at finding thing that would preclude operation.  She has a trip to [place] planned after New Years. 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  Plan: Pulmonary function testing with room air blood gas  Right VATS, wedge resection, possible right upper lobectomy on Thursday, January 9 

## 2013-02-01 ENCOUNTER — Other Ambulatory Visit: Payer: Self-pay | Admitting: *Deleted

## 2013-02-01 DIAGNOSIS — R918 Other nonspecific abnormal finding of lung field: Secondary | ICD-10-CM

## 2013-02-03 ENCOUNTER — Ambulatory Visit (HOSPITAL_COMMUNITY)
Admission: RE | Admit: 2013-02-03 | Discharge: 2013-02-03 | Disposition: A | Discharge status: Home / Self Care | Payer: BC Managed Care – PPO | Source: Ambulatory Visit | Attending: Thoracic Surgery (Cardiothoracic Vascular Surgery) | Admitting: Thoracic Surgery (Cardiothoracic Vascular Surgery)

## 2013-02-03 DIAGNOSIS — R911 Solitary pulmonary nodule: Secondary | ICD-10-CM

## 2013-02-03 DIAGNOSIS — J4489 Other specified chronic obstructive pulmonary disease: Secondary | ICD-10-CM | POA: Insufficient documentation

## 2013-02-03 DIAGNOSIS — J449 Chronic obstructive pulmonary disease, unspecified: Secondary | ICD-10-CM | POA: Insufficient documentation

## 2013-02-03 DIAGNOSIS — J984 Other disorders of lung: Secondary | ICD-10-CM | POA: Insufficient documentation

## 2013-02-03 LAB — PULMONARY FUNCTION TEST
DLCO cor % pred: 79 %
DLCO cor: 17.24 ml/min/mmHg
DLCO unc % pred: 79 %
DLCO unc: 17.24 ml/min/mmHg
FEF 25-75 Pre: 1.44 L/sec
FEF2575-%Change-Post: 28 %
FEF2575-%Pred-Post: 87 %
FEV1-%Change-Post: 5 %
FEV1-%Pred-Post: 91 %
FEV1-%Pred-Pre: 87 %
FEV1FVC-%Change-Post: 7 %
FEV6-%Pred-Post: 89 %
FEV6-Pre: 2.64 L
FEV6FVC-%Pred-Pre: 102 %
FVC-%Pred-Pre: 89 %
FVC-Post: 2.6 L
Post FEV1/FVC ratio: 80 %
Post FEV6/FVC ratio: 100 %
Pre FEV6/FVC Ratio: 99 %

## 2013-02-03 MED ORDER — ALBUTEROL SULFATE (5 MG/ML) 0.5% IN NEBU
2.5000 mg | INHALATION_SOLUTION | Freq: Once | RESPIRATORY_TRACT | Status: AC
  Administered 2013-02-03: 2.5 mg via RESPIRATORY_TRACT

## 2013-02-13 ENCOUNTER — Encounter (HOSPITAL_COMMUNITY): Payer: Self-pay

## 2013-02-22 ENCOUNTER — Encounter (HOSPITAL_COMMUNITY): Payer: Self-pay

## 2013-02-22 ENCOUNTER — Encounter (HOSPITAL_COMMUNITY)
Admission: RE | Admit: 2013-02-22 | Discharge: 2013-02-22 | Disposition: A | Discharge status: Home / Self Care | Payer: BC Managed Care – PPO | Source: Ambulatory Visit | Attending: Thoracic Surgery (Cardiothoracic Vascular Surgery) | Admitting: Thoracic Surgery (Cardiothoracic Vascular Surgery)

## 2013-02-22 VITALS — BP 127/82 | HR 74 | Temp 98.1°F | Resp 18 | Ht 62.0 in | Wt 149.9 lb

## 2013-02-22 DIAGNOSIS — Z01818 Encounter for other preprocedural examination: Secondary | ICD-10-CM | POA: Insufficient documentation

## 2013-02-22 DIAGNOSIS — Z01811 Encounter for preprocedural respiratory examination: Secondary | ICD-10-CM

## 2013-02-22 DIAGNOSIS — R918 Other nonspecific abnormal finding of lung field: Secondary | ICD-10-CM

## 2013-02-22 DIAGNOSIS — Z01812 Encounter for preprocedural laboratory examination: Secondary | ICD-10-CM | POA: Insufficient documentation

## 2013-02-22 DIAGNOSIS — Z01810 Encounter for preprocedural cardiovascular examination: Secondary | ICD-10-CM

## 2013-02-22 LAB — URINALYSIS, ROUTINE W REFLEX MICROSCOPIC
Bilirubin Urine: NEGATIVE
Glucose, UA: NEGATIVE mg/dL
HGB URINE DIPSTICK: NEGATIVE
Ketones, ur: NEGATIVE mg/dL
Leukocytes, UA: NEGATIVE
Nitrite: NEGATIVE
PROTEIN: NEGATIVE mg/dL
Specific Gravity, Urine: 1.008 (ref 1.005–1.030)
UROBILINOGEN UA: 0.2 mg/dL (ref 0.0–1.0)
pH: 5.5 (ref 5.0–8.0)

## 2013-02-22 LAB — COMPREHENSIVE METABOLIC PANEL
ALBUMIN: 3.7 g/dL (ref 3.5–5.2)
ALT: 17 U/L (ref 0–35)
AST: 22 U/L (ref 0–37)
Alkaline Phosphatase: 62 U/L (ref 39–117)
BUN: 15 mg/dL (ref 6–23)
CO2: 20 mEq/L (ref 19–32)
Calcium: 9.3 mg/dL (ref 8.4–10.5)
Chloride: 105 mEq/L (ref 96–112)
Creatinine, Ser: 0.8 mg/dL (ref 0.50–1.10)
GFR calc Af Amer: 89 mL/min — ABNORMAL LOW (ref >90)
GFR calc non Af Amer: 77 mL/min — ABNORMAL LOW (ref >90)
Glucose, Bld: 106 mg/dL — ABNORMAL HIGH (ref 70–99)
Potassium: 4.2 mEq/L (ref 3.7–5.3)
SODIUM: 143 meq/L (ref 137–147)
TOTAL PROTEIN: 7.1 g/dL (ref 6.0–8.3)
Total Bilirubin: 0.3 mg/dL (ref 0.3–1.2)

## 2013-02-22 LAB — CBC
HCT: 35.9 % — ABNORMAL LOW (ref 36.0–46.0)
Hemoglobin: 12 g/dL (ref 12.0–15.0)
MCH: 30 pg (ref 26.0–34.0)
MCHC: 33.4 g/dL (ref 30.0–36.0)
MCV: 89.8 fL (ref 78.0–100.0)
PLATELETS: 186 10*3/uL (ref 150–400)
RBC: 4 MIL/uL (ref 3.87–5.11)
RDW: 13.5 % (ref 11.5–15.5)
WBC: 5.2 10*3/uL (ref 4.0–10.5)

## 2013-02-22 LAB — TYPE AND SCREEN
ABO/RH(D): A POS
ANTIBODY SCREEN: NEGATIVE

## 2013-02-22 LAB — SURGICAL PCR SCREEN
MRSA, PCR: NEGATIVE
Staphylococcus aureus: POSITIVE — AB

## 2013-02-22 LAB — BLOOD GAS, ARTERIAL
Acid-Base Excess: 0.6 mmol/L (ref 0.0–2.0)
Bicarbonate: 23.7 mEq/L (ref 20.0–24.0)
Drawn by: 344381
FIO2: 0.21 %
O2 SAT: 97.6 %
PATIENT TEMPERATURE: 98.6
PH ART: 7.487 — AB (ref 7.350–7.450)
TCO2: 24.7 mmol/L (ref 0–100)
pCO2 arterial: 31.6 mmHg — ABNORMAL LOW (ref 35.0–45.0)
pO2, Arterial: 82.6 mmHg (ref 80.0–100.0)

## 2013-02-22 LAB — PROTIME-INR
INR: 1.04 (ref 0.00–1.49)
Prothrombin Time: 13.4 seconds (ref 11.6–15.2)

## 2013-02-22 LAB — APTT: aPTT: 29 seconds (ref 24–37)

## 2013-02-22 LAB — ABO/RH: ABO/RH(D): A POS

## 2013-02-22 NOTE — Pre-Procedure Instructions (Addendum)
Kristy Morrow  02/22/2013   Your procedure is scheduled on:  02/24/13  Report to [facility] short stay admitting at 530 AM.  Call this number if you have problems the morning of surgery: [phone]   Remember:   Do not eat food or drink liquids after midnight.   Take these medicines the morning of surgery with A SIP OF WATER: metoprolol            STOP all herbel meds, nsaids (aleve,naproxen,advil,ibuprofen) now including vitamins   Do not wear jewelry, make-up or nail polish.  Do not wear lotions, powders, or perfumes. You may wear deodorant.  Do not shave 48 hours prior to surgery. Men may shave face and neck.  Do not bring valuables to the hospital.  [facility] is not responsible                  for any belongings or valuables.               Contacts, dentures or bridgework may not be worn into surgery.  Leave suitcase in the car. After surgery it may be brought to your room.  For patients admitted to the hospital, discharge time is determined by your                treatment team.               Patients discharged the day of surgery will not be allowed to drive  home.  Name and phone number of your driver:   Special Instructions: Shower using CHG 2 nights before surgery and the night before surgery.  If you shower the day of surgery use CHG.  Use special wash - you have one bottle of CHG for all showers.  You should use approximately 1/3 of the bottle for each shower.   Please read over the following fact sheets that you were given: Pain Booklet, Coughing and Deep Breathing, Blood Transfusion Information, MRSA Information and Surgical Site Infection Prevention

## 2013-02-23 ENCOUNTER — Telehealth: Payer: Self-pay

## 2013-02-23 DIAGNOSIS — A4901 Methicillin susceptible Staphylococcus aureus infection, unspecified site: Secondary | ICD-10-CM

## 2013-02-23 MED ORDER — DEXTROSE 5 % IV SOLN
1.5000 g | INTRAVENOUS | Status: AC
  Administered 2013-02-24: 1.5 g via INTRAVENOUS
  Filled 2013-02-23: qty 1.5

## 2013-02-23 MED ORDER — MUPIROCIN 2 % EX OINT: 1.0000 "application " | TOPICAL_OINTMENT | Freq: Two times a day (BID) | CUTANEOUS | Status: DC

## 2013-02-23 NOTE — Telephone Encounter (Signed)
RX for mupirocin 2% oint. Apply to inside of both nostrils BID x 5 days called to [facility]. Pt notified.

## 2013-02-24 ENCOUNTER — Ambulatory Visit (HOSPITAL_COMMUNITY): Payer: BC Managed Care – PPO | Admitting: Certified Registered"

## 2013-02-24 ENCOUNTER — Inpatient Hospital Stay (HOSPITAL_COMMUNITY)
Admission: RE | Admit: 2013-02-24 | Discharge: 2013-03-03 | DRG: 164 | Disposition: A | Discharge status: Home / Self Care | Payer: BC Managed Care – PPO | Source: Ambulatory Visit | Attending: Thoracic Surgery (Cardiothoracic Vascular Surgery) | Admitting: Thoracic Surgery (Cardiothoracic Vascular Surgery)

## 2013-02-24 ENCOUNTER — Inpatient Hospital Stay (HOSPITAL_COMMUNITY): Payer: BC Managed Care – PPO

## 2013-02-24 ENCOUNTER — Encounter (HOSPITAL_COMMUNITY): Payer: Self-pay | Admitting: *Deleted

## 2013-02-24 ENCOUNTER — Encounter (HOSPITAL_COMMUNITY)
Admission: RE | Disposition: A | Discharge status: Home / Self Care | Payer: Self-pay | Source: Ambulatory Visit | Attending: Thoracic Surgery (Cardiothoracic Vascular Surgery)

## 2013-02-24 ENCOUNTER — Encounter (HOSPITAL_COMMUNITY): Payer: BC Managed Care – PPO | Admitting: Certified Registered"

## 2013-02-24 DIAGNOSIS — J449 Chronic obstructive pulmonary disease, unspecified: Secondary | ICD-10-CM | POA: Diagnosis present

## 2013-02-24 DIAGNOSIS — C3410 Malignant neoplasm of upper lobe, unspecified bronchus or lung: Principal | ICD-10-CM | POA: Diagnosis present

## 2013-02-24 DIAGNOSIS — Z87891 Personal history of nicotine dependence: Secondary | ICD-10-CM

## 2013-02-24 DIAGNOSIS — Z79899 Other long term (current) drug therapy: Secondary | ICD-10-CM

## 2013-02-24 DIAGNOSIS — E559 Vitamin D deficiency, unspecified: Secondary | ICD-10-CM | POA: Diagnosis present

## 2013-02-24 DIAGNOSIS — J988 Other specified respiratory disorders: Secondary | ICD-10-CM | POA: Diagnosis present

## 2013-02-24 DIAGNOSIS — I10 Essential (primary) hypertension: Secondary | ICD-10-CM | POA: Diagnosis present

## 2013-02-24 DIAGNOSIS — E876 Hypokalemia: Secondary | ICD-10-CM | POA: Diagnosis not present

## 2013-02-24 DIAGNOSIS — Z807 Family history of other malignant neoplasms of lymphoid, hematopoietic and related tissues: Secondary | ICD-10-CM

## 2013-02-24 DIAGNOSIS — G8918 Other acute postprocedural pain: Secondary | ICD-10-CM | POA: Diagnosis not present

## 2013-02-24 DIAGNOSIS — C3491 Malignant neoplasm of unspecified part of right bronchus or lung: Secondary | ICD-10-CM | POA: Diagnosis present

## 2013-02-24 DIAGNOSIS — D62 Acute posthemorrhagic anemia: Secondary | ICD-10-CM | POA: Diagnosis not present

## 2013-02-24 DIAGNOSIS — R918 Other nonspecific abnormal finding of lung field: Secondary | ICD-10-CM

## 2013-02-24 DIAGNOSIS — Z8543 Personal history of malignant neoplasm of ovary: Secondary | ICD-10-CM

## 2013-02-24 DIAGNOSIS — J4489 Other specified chronic obstructive pulmonary disease: Secondary | ICD-10-CM | POA: Diagnosis present

## 2013-02-24 DIAGNOSIS — Z01812 Encounter for preprocedural laboratory examination: Secondary | ICD-10-CM

## 2013-02-24 DIAGNOSIS — E871 Hypo-osmolality and hyponatremia: Secondary | ICD-10-CM

## 2013-02-24 DIAGNOSIS — D381 Neoplasm of uncertain behavior of trachea, bronchus and lung: Secondary | ICD-10-CM

## 2013-02-24 HISTORY — PX: VIDEO ASSISTED THORACOSCOPY (VATS)/WEDGE RESECTION: SHX6174

## 2013-02-24 SURGERY — VIDEO ASSISTED THORACOSCOPY (VATS)/WEDGE RESECTION
Anesthesia: General | Site: Chest | Laterality: Right

## 2013-02-24 MED ORDER — KETOROLAC TROMETHAMINE 15 MG/ML IJ SOLN
15.0000 mg | Freq: Four times a day (QID) | INTRAMUSCULAR | Status: AC
  Administered 2013-02-24 – 2013-02-26 (×8): 15 mg via INTRAVENOUS
  Filled 2013-02-24 (×11): qty 1

## 2013-02-24 MED ORDER — HYDROMORPHONE HCL PF 1 MG/ML IJ SOLN
0.2500 mg | INTRAMUSCULAR | Status: DC | PRN
  Administered 2013-02-24 (×4): 0.5 mg via INTRAVENOUS

## 2013-02-24 MED ORDER — VECURONIUM BROMIDE 10 MG IV SOLR
INTRAVENOUS | Status: DC | PRN
  Administered 2013-02-24: 1 mg via INTRAVENOUS
  Administered 2013-02-24: 2 mg via INTRAVENOUS

## 2013-02-24 MED ORDER — MULTIVITAMINS PO CAPS: 1.0000 | ORAL_CAPSULE | Freq: Every day | ORAL | Status: DC

## 2013-02-24 MED ORDER — METOPROLOL SUCCINATE ER 50 MG PO TB24
50.0000 mg | ORAL_TABLET | Freq: Every day | ORAL | Status: DC
  Filled 2013-02-24 (×2): qty 1

## 2013-02-24 MED ORDER — FENTANYL 10 MCG/ML IV SOLN
INTRAVENOUS | Status: DC
  Administered 2013-02-24: 12:00:00 via INTRAVENOUS
  Administered 2013-02-25 (×2): 10 ug via INTRAVENOUS
  Filled 2013-02-24: qty 50

## 2013-02-24 MED ORDER — ACETAMINOPHEN 160 MG/5ML PO SOLN
1000.0000 mg | Freq: Four times a day (QID) | ORAL | Status: AC
  Administered 2013-02-24: 1000 mg via ORAL
  Filled 2013-02-24 (×3): qty 40
  Filled 2013-02-24: qty 40.6

## 2013-02-24 MED ORDER — ONDANSETRON HCL 4 MG/2ML IJ SOLN: 4.0000 mg | Freq: Four times a day (QID) | INTRAMUSCULAR | Status: DC | PRN

## 2013-02-24 MED ORDER — SENNOSIDES-DOCUSATE SODIUM 8.6-50 MG PO TABS
1.0000 | ORAL_TABLET | Freq: Every evening | ORAL | Status: DC | PRN
  Filled 2013-02-24: qty 1

## 2013-02-24 MED ORDER — BUPIVACAINE ON-Q PAIN PUMP (FOR ORDER SET NO CHG)
INJECTION | Status: DC
Start: 2013-02-24 — End: 2013-02-27
  Filled 2013-02-24: qty 1

## 2013-02-24 MED ORDER — HYDROMORPHONE HCL PF 1 MG/ML IJ SOLN
INTRAMUSCULAR | Status: AC
  Administered 2013-02-24: 0.5 mg via INTRAVENOUS
  Filled 2013-02-24: qty 1

## 2013-02-24 MED ORDER — OXYCODONE-ACETAMINOPHEN 5-325 MG PO TABS
1.0000 | ORAL_TABLET | ORAL | Status: DC | PRN
  Administered 2013-02-25 – 2013-02-26 (×3): 2 via ORAL
  Administered 2013-02-27 (×4): 1 via ORAL
  Filled 2013-02-24 (×3): qty 1
  Filled 2013-02-24: qty 2
  Filled 2013-02-24: qty 1
  Filled 2013-02-24 (×2): qty 2
  Filled 2013-02-24: qty 1

## 2013-02-24 MED ORDER — SODIUM CHLORIDE 0.9 % IJ SOLN
9.0000 mL | INTRAMUSCULAR | Status: DC | PRN
  Administered 2013-02-27: 20 mL via INTRAVENOUS

## 2013-02-24 MED ORDER — LIDOCAINE HCL (CARDIAC) 20 MG/ML IV SOLN
INTRAVENOUS | Status: DC | PRN
  Administered 2013-02-24: 60 mg via INTRAVENOUS

## 2013-02-24 MED ORDER — ADULT MULTIVITAMIN W/MINERALS CH
1.0000 | ORAL_TABLET | Freq: Every day | ORAL | Status: DC
  Administered 2013-02-25 – 2013-03-01 (×5): 1 via ORAL
  Filled 2013-02-24 (×8): qty 1

## 2013-02-24 MED ORDER — MUPIROCIN 2 % EX OINT
TOPICAL_OINTMENT | CUTANEOUS | Status: AC
  Filled 2013-02-24: qty 22

## 2013-02-24 MED ORDER — PROPOFOL 10 MG/ML IV BOLUS
INTRAVENOUS | Status: DC | PRN
  Administered 2013-02-24: 200 mg via INTRAVENOUS

## 2013-02-24 MED ORDER — LACTATED RINGERS IV SOLN
INTRAVENOUS | Status: DC | PRN
  Administered 2013-02-24: 07:00:00 via INTRAVENOUS

## 2013-02-24 MED ORDER — DIPHENHYDRAMINE HCL 50 MG/ML IJ SOLN: 12.5000 mg | Freq: Four times a day (QID) | INTRAMUSCULAR | Status: DC | PRN

## 2013-02-24 MED ORDER — KCL IN DEXTROSE-NACL 20-5-0.45 MEQ/L-%-% IV SOLN
INTRAVENOUS | Status: DC
  Administered 2013-02-24: 12:00:00 via INTRAVENOUS
  Administered 2013-02-24 – 2013-02-25 (×2): 1000 mL via INTRAVENOUS
  Filled 2013-02-24 (×6): qty 1000

## 2013-02-24 MED ORDER — ONDANSETRON HCL 4 MG/2ML IJ SOLN: 4.0000 mg | Freq: Once | INTRAMUSCULAR | Status: DC | PRN

## 2013-02-24 MED ORDER — MIDAZOLAM HCL 5 MG/5ML IJ SOLN
INTRAMUSCULAR | Status: DC | PRN
  Administered 2013-02-24: 2 mg via INTRAVENOUS

## 2013-02-24 MED ORDER — NALOXONE HCL 0.4 MG/ML IJ SOLN
0.4000 mg | INTRAMUSCULAR | Status: DC | PRN
Start: 2013-02-24 — End: 2013-03-01

## 2013-02-24 MED ORDER — BUPIVACAINE 0.5 % ON-Q PUMP SINGLE CATH 400 ML
400.0000 mL | INJECTION | Status: DC
  Filled 2013-02-24: qty 400

## 2013-02-24 MED ORDER — BISACODYL 5 MG PO TBEC
10.0000 mg | DELAYED_RELEASE_TABLET | Freq: Every day | ORAL | Status: DC
  Administered 2013-02-25 – 2013-03-02 (×5): 10 mg via ORAL
  Filled 2013-02-24 (×6): qty 2

## 2013-02-24 MED ORDER — VITAMIN D3 25 MCG (1000 UNIT) PO TABS
1000.0000 [IU] | ORAL_TABLET | Freq: Every day | ORAL | Status: DC
  Administered 2013-02-25 – 2013-03-02 (×6): 1000 [IU] via ORAL
  Filled 2013-02-24 (×8): qty 1

## 2013-02-24 MED ORDER — HEMOSTATIC AGENTS (NO CHARGE) OPTIME
TOPICAL | Status: DC | PRN
  Administered 2013-02-24: 1 via TOPICAL

## 2013-02-24 MED ORDER — ALBUMIN HUMAN 5 % IV SOLN
12.5000 g | Freq: Once | INTRAVENOUS | Status: AC
  Administered 2013-02-24: 12.5 g via INTRAVENOUS

## 2013-02-24 MED ORDER — ALBUMIN HUMAN 5 % IV SOLN
INTRAVENOUS | Status: AC
  Administered 2013-02-24: 12.5 g via INTRAVENOUS
  Filled 2013-02-24: qty 250

## 2013-02-24 MED ORDER — FENTANYL CITRATE 0.05 MG/ML IJ SOLN
INTRAMUSCULAR | Status: DC | PRN
  Administered 2013-02-24: 25 ug via INTRAVENOUS
  Administered 2013-02-24: 50 ug via INTRAVENOUS
  Administered 2013-02-24: 150 ug via INTRAVENOUS
  Administered 2013-02-24: 50 ug via INTRAVENOUS
  Administered 2013-02-24 (×4): 25 ug via INTRAVENOUS

## 2013-02-24 MED ORDER — NEOSTIGMINE METHYLSULFATE 1 MG/ML IJ SOLN
INTRAMUSCULAR | Status: DC | PRN
  Administered 2013-02-24: 4 mg via INTRAVENOUS

## 2013-02-24 MED ORDER — ROCURONIUM BROMIDE 100 MG/10ML IV SOLN
INTRAVENOUS | Status: DC | PRN
  Administered 2013-02-24: 50 mg via INTRAVENOUS

## 2013-02-24 MED ORDER — CHLORHEXIDINE GLUCONATE CLOTH 2 % EX PADS
6.0000 | MEDICATED_PAD | Freq: Every day | CUTANEOUS | Status: AC
  Administered 2013-02-24 – 2013-02-28 (×5): 6 via TOPICAL

## 2013-02-24 MED ORDER — OXYCODONE HCL 5 MG PO TABS: 5.0000 mg | ORAL_TABLET | ORAL | Status: AC | PRN

## 2013-02-24 MED ORDER — MUPIROCIN 2 % EX OINT
1.0000 "application " | TOPICAL_OINTMENT | Freq: Two times a day (BID) | CUTANEOUS | Status: AC
  Administered 2013-02-24 – 2013-02-28 (×8): 1 via NASAL
  Filled 2013-02-24: qty 22

## 2013-02-24 MED ORDER — ONDANSETRON HCL 4 MG/2ML IJ SOLN
INTRAMUSCULAR | Status: DC | PRN
  Administered 2013-02-24 (×2): 4 mg via INTRAVENOUS

## 2013-02-24 MED ORDER — DEXTROSE 5 % IV SOLN
1.5000 g | Freq: Two times a day (BID) | INTRAVENOUS | Status: AC
  Administered 2013-02-24 – 2013-02-25 (×2): 1.5 g via INTRAVENOUS
  Filled 2013-02-24 (×2): qty 1.5

## 2013-02-24 MED ORDER — PHENYLEPHRINE HCL 10 MG/ML IJ SOLN
10.0000 mg | INTRAVENOUS | Status: DC | PRN
  Administered 2013-02-24: 20 ug/min via INTRAVENOUS

## 2013-02-24 MED ORDER — GLYCOPYRROLATE 0.2 MG/ML IJ SOLN
INTRAMUSCULAR | Status: DC | PRN
  Administered 2013-02-24: .6 mg via INTRAVENOUS

## 2013-02-24 MED ORDER — KCL IN DEXTROSE-NACL 20-5-0.45 MEQ/L-%-% IV SOLN
INTRAVENOUS | Status: AC
  Filled 2013-02-24: qty 1000

## 2013-02-24 MED ORDER — POTASSIUM CHLORIDE 10 MEQ/50ML IV SOLN: 10.0000 meq | Freq: Every day | INTRAVENOUS | Status: DC | PRN

## 2013-02-24 MED ORDER — DIPHENHYDRAMINE HCL 12.5 MG/5ML PO ELIX
12.5000 mg | ORAL_SOLUTION | Freq: Four times a day (QID) | ORAL | Status: DC | PRN
  Filled 2013-02-24: qty 5

## 2013-02-24 MED ORDER — VITAMIN D 1000 UNITS PO CAPS: 1000.0000 [IU] | ORAL_CAPSULE | Freq: Every day | ORAL | Status: DC

## 2013-02-24 MED ORDER — MIDAZOLAM HCL 2 MG/2ML IJ SOLN
INTRAMUSCULAR | Status: AC
  Filled 2013-02-24: qty 2

## 2013-02-24 MED ORDER — EPHEDRINE SULFATE 50 MG/ML IJ SOLN
INTRAMUSCULAR | Status: DC | PRN
Start: 2013-02-24 — End: 2013-02-24
  Administered 2013-02-24: 10 mg via INTRAVENOUS

## 2013-02-24 MED ORDER — 0.9 % SODIUM CHLORIDE (POUR BTL) OPTIME
TOPICAL | Status: DC | PRN
  Administered 2013-02-24: 1000 mL
  Administered 2013-02-24: 2000 mL

## 2013-02-24 MED ORDER — ONDANSETRON HCL 4 MG/2ML IJ SOLN
4.0000 mg | Freq: Four times a day (QID) | INTRAMUSCULAR | Status: DC | PRN
  Administered 2013-02-27 – 2013-02-28 (×3): 4 mg via INTRAVENOUS
  Filled 2013-02-24 (×3): qty 2

## 2013-02-24 MED ORDER — BUPIVACAINE 0.5 % ON-Q PUMP SINGLE CATH 400 ML
INJECTION | Status: DC | PRN
  Administered 2013-02-24: 400 mL

## 2013-02-24 MED ORDER — TRAMADOL HCL 50 MG PO TABS
50.0000 mg | ORAL_TABLET | Freq: Four times a day (QID) | ORAL | Status: DC | PRN
  Administered 2013-02-26 – 2013-02-28 (×5): 50 mg via ORAL
  Filled 2013-02-24 (×3): qty 1
  Filled 2013-02-24: qty 2
  Filled 2013-02-24: qty 1

## 2013-02-24 MED ORDER — ACETAMINOPHEN 500 MG PO TABS
1000.0000 mg | ORAL_TABLET | Freq: Four times a day (QID) | ORAL | Status: AC
  Administered 2013-02-25 (×3): 1000 mg via ORAL
  Filled 2013-02-24 (×3): qty 2

## 2013-02-24 SURGICAL SUPPLY — 85 items
ADH SKN CLS LQ APL DERMABOND (GAUZE/BANDAGES/DRESSINGS) ×2
APL SKNCLS STERI-STRIP NONHPOA (GAUZE/BANDAGES/DRESSINGS) ×2
BAG SPEC RTRVL LRG 6X4 10 (ENDOMECHANICALS) ×4
BENZOIN TINCTURE PRP APPL 2/3 (GAUZE/BANDAGES/DRESSINGS) ×3 IMPLANT
CANISTER SUCTION 2500CC (MISCELLANEOUS) ×4 IMPLANT
CATH KIT ON Q 5IN SLV (PAIN MANAGEMENT) ×2 IMPLANT
CATH THORACIC 28FR (CATHETERS) ×2 IMPLANT
CATH THORACIC 36FR (CATHETERS) IMPLANT
CATH THORACIC 36FR RT ANG (CATHETERS) IMPLANT
CLIP TI MEDIUM 6 (CLIP) ×3 IMPLANT
CONN ST 1/4X3/8  BEN (MISCELLANEOUS) ×1
CONN ST 1/4X3/8 BEN (MISCELLANEOUS) ×1 IMPLANT
CONN Y 3/8X3/8X3/8  BEN (MISCELLANEOUS) ×1
CONN Y 3/8X3/8X3/8 BEN (MISCELLANEOUS) ×1 IMPLANT
CONT SPEC 4OZ CLIKSEAL STRL BL (MISCELLANEOUS) ×22 IMPLANT
COVER SURGICAL LIGHT HANDLE (MISCELLANEOUS) ×3 IMPLANT
DERMABOND ADHESIVE PROPEN (GAUZE/BANDAGES/DRESSINGS) ×1
DERMABOND ADVANCED .7 DNX6 (GAUZE/BANDAGES/DRESSINGS) ×1 IMPLANT
DRAIN CHANNEL 28F RND 3/8 FF (WOUND CARE) IMPLANT
DRAIN CHANNEL 32F RND 10.7 FF (WOUND CARE) ×2 IMPLANT
DRAPE LAPAROSCOPIC ABDOMINAL (DRAPES) ×3 IMPLANT
DRAPE WARM FLUID 44X44 (DRAPE) ×3 IMPLANT
ELECT REM PT RETURN 9FT ADLT (ELECTROSURGICAL) ×3
ELECTRODE REM PT RTRN 9FT ADLT (ELECTROSURGICAL) ×2 IMPLANT
GLOVE BIO SURGEON STRL SZ 6 (GLOVE) ×2 IMPLANT
GLOVE BIOGEL PI IND STRL 6.5 (GLOVE) ×2 IMPLANT
GLOVE BIOGEL PI INDICATOR 6.5 (GLOVE) ×2
GLOVE ECLIPSE 6.5 STRL STRAW (GLOVE) ×2 IMPLANT
GLOVE SURG SIGNA 7.5 PF LTX (GLOVE) ×6 IMPLANT
GLOVE SURG SS PI 6.5 STRL IVOR (GLOVE) ×2 IMPLANT
GOWN PREVENTION PLUS XLARGE (GOWN DISPOSABLE) ×4 IMPLANT
GOWN STRL NON-REIN LRG LVL3 (GOWN DISPOSABLE) ×6 IMPLANT
HANDLE STAPLE ENDO GIA SHORT (STAPLE) ×1
HEMOSTAT SURGICEL 2X14 (HEMOSTASIS) ×2 IMPLANT
KIT BASIN OR (CUSTOM PROCEDURE TRAY) ×3 IMPLANT
KIT ROOM TURNOVER OR (KITS) ×3 IMPLANT
KIT SUCTION CATH 14FR (SUCTIONS) ×1 IMPLANT
NS IRRIG 1000ML POUR BTL (IV SOLUTION) ×6 IMPLANT
PACK CHEST (CUSTOM PROCEDURE TRAY) ×3 IMPLANT
PAD ARMBOARD 7.5X6 YLW CONV (MISCELLANEOUS) ×8 IMPLANT
POUCH ENDO CATCH II 15MM (MISCELLANEOUS) ×2 IMPLANT
POUCH SPECIMEN RETRIEVAL 10MM (ENDOMECHANICALS) ×4 IMPLANT
RELOAD EGIA 45 MED/THCK PURPLE (STAPLE) ×8 IMPLANT
RELOAD EGIA 45 TAN VASC (STAPLE) ×4 IMPLANT
RELOAD EGIA 60 MED/THCK PURPLE (STAPLE) ×15 IMPLANT
RELOAD EGIA TRIS TAN 45 CVD (STAPLE) ×6 IMPLANT
RELOAD STAPLE 45 TAN MED CVD (STAPLE) IMPLANT
RELOAD STAPLE 60 MED/THCK ART (STAPLE) IMPLANT
SEALANT PROGEL (MISCELLANEOUS) ×2 IMPLANT
SEALANT SURG COSEAL 4ML (VASCULAR PRODUCTS) IMPLANT
SEALANT SURG COSEAL 8ML (VASCULAR PRODUCTS) IMPLANT
SOLUTION ANTI FOG 6CC (MISCELLANEOUS) ×3 IMPLANT
SPECIMEN JAR MEDIUM (MISCELLANEOUS) ×1 IMPLANT
SPONGE GAUZE 4X4 12PLY (GAUZE/BANDAGES/DRESSINGS) ×3 IMPLANT
STAPLER ENDO GIA 12 SHRT THIN (STAPLE) IMPLANT
STAPLER ENDO GIA 12MM SHORT (STAPLE) ×2 IMPLANT
SUT PROLENE 4 0 RB 1 (SUTURE)
SUT PROLENE 4-0 RB1 .5 CRCL 36 (SUTURE) IMPLANT
SUT SILK  1 MH (SUTURE) ×2
SUT SILK 1 MH (SUTURE) ×3 IMPLANT
SUT SILK 2 0SH CR/8 30 (SUTURE) IMPLANT
SUT SILK 3 0SH CR/8 30 (SUTURE) ×2 IMPLANT
SUT VIC AB 0 CTX 27 (SUTURE) ×2 IMPLANT
SUT VIC AB 1 CTX 27 (SUTURE) ×2 IMPLANT
SUT VIC AB 2-0 CT1 27 (SUTURE)
SUT VIC AB 2-0 CT1 TAPERPNT 27 (SUTURE) IMPLANT
SUT VIC AB 2-0 CTX 36 (SUTURE) ×2 IMPLANT
SUT VIC AB 3-0 MH 27 (SUTURE) IMPLANT
SUT VIC AB 3-0 SH 27 (SUTURE) ×15
SUT VIC AB 3-0 SH 27X BRD (SUTURE) ×5 IMPLANT
SUT VIC AB 3-0 X1 27 (SUTURE) ×4 IMPLANT
SUT VICRYL 0 UR6 27IN ABS (SUTURE) ×6 IMPLANT
SUT VICRYL 2 TP 1 (SUTURE) IMPLANT
SWAB COLLECTION DEVICE MRSA (MISCELLANEOUS) IMPLANT
SYSTEM SAHARA CHEST DRAIN ATS (WOUND CARE) ×3 IMPLANT
TAPE CLOTH SURG 4X10 WHT LF (GAUZE/BANDAGES/DRESSINGS) ×2 IMPLANT
TIP APPLICATOR SPRAY EXTEND 16 (VASCULAR PRODUCTS) ×2 IMPLANT
TOWEL OR 17X24 6PK STRL BLUE (TOWEL DISPOSABLE) ×3 IMPLANT
TOWEL OR 17X26 10 PK STRL BLUE (TOWEL DISPOSABLE) ×6 IMPLANT
TRAP SPECIMEN MUCOUS 40CC (MISCELLANEOUS) IMPLANT
TRAY FOLEY CATH 16FRSI W/METER (SET/KITS/TRAYS/PACK) ×3 IMPLANT
TROCAR XCEL BLADELESS 5X75MML (TROCAR) ×2 IMPLANT
TUBE ANAEROBIC SPECIMEN COL (MISCELLANEOUS) IMPLANT
TUNNELER SHEATH ON-Q 11GX8 DSP (PAIN MANAGEMENT) ×2 IMPLANT
WATER STERILE IRR 1000ML POUR (IV SOLUTION) ×4 IMPLANT

## 2013-02-24 NOTE — Transfer of Care (Signed)
Immediate Anesthesia Transfer of Care Note  Patient: Kristy Morrow  Procedure(s) Performed: Procedure(s): VIDEO ASSISTED THORACOSCOPY (VATS)/WEDGE RESECTION RIGHT LUNG UPPER LOBECTOMY,NODE SAMPLING (Right)  Patient Location: PACU  Anesthesia Type:General  Level of Consciousness: awake, alert , oriented and patient cooperative  Airway & Oxygen Therapy: Patient Spontanous Breathing and Patient connected to nasal cannula oxygen  Post-op Assessment: Report given to PACU RN, Post -op Vital signs reviewed and stable and Patient moving all extremities X 4  Post vital signs: Reviewed and stable  Complications: No apparent anesthesia complications

## 2013-02-24 NOTE — Anesthesia Postprocedure Evaluation (Signed)
  Anesthesia Post-op Note  Patient: Kristy Morrow  Procedure(s) Performed: Procedure(s): VIDEO ASSISTED THORACOSCOPY (VATS)/WEDGE RESECTION RIGHT LUNG UPPER LOBECTOMY,NODE SAMPLING (Right)  Patient Location: PACU  Anesthesia Type:General  Level of Consciousness: awake, alert , sedated and patient cooperative  Airway and Oxygen Therapy: Patient Spontanous Breathing  Post-op Pain: mild, moderate  Post-op Assessment: Post-op Vital signs reviewed, Patient's Cardiovascular Status Stable, Respiratory Function Stable, Patent Airway, No signs of Nausea or vomiting and Pain level controlled  Post-op Vital Signs: stable  Complications: No apparent anesthesia complications

## 2013-02-24 NOTE — Interval H&P Note (Signed)
History and Physical Interval Note:  02/24/2013 7:26 AM  Kristy Morrow  has presented today for surgery, with the diagnosis of RUL LUNG MASS  The various methods of treatment have been discussed with the patient and family. After consideration of risks, benefits and other options for treatment, the patient has consented to  Procedure(s) with comments: VIDEO ASSISTED THORACOSCOPY (VATS)/WEDGE RESECTION (Right) LOBECTOMY (Right) - POSSIBLE RUL LOBECTOMY  as a surgical intervention .  The patient's history has been reviewed, patient examined, no change in status, stable for surgery.  I have reviewed the patient's chart and labs.  Questions were answered to the patient's satisfaction.     Daren Doswell C

## 2013-02-24 NOTE — Anesthesia Preprocedure Evaluation (Addendum)
Anesthesia Evaluation  Patient identified by MRN, date of birth, ID band Patient awake    Reviewed: Allergy & Precautions, H&P , NPO status   Airway Mallampati: II TM Distance: >3 FB Neck ROM: Full    Dental  (+) Teeth Intact and Dental Advisory Given   Pulmonary COPDformer smoker,          Cardiovascular hypertension, Pt. on medications and Pt. on home beta blockers     Neuro/Psych    GI/Hepatic   Endo/Other    Renal/GU      Musculoskeletal   Abdominal   Peds  Hematology   Anesthesia Other Findings R Lung mass  Reproductive/Obstetrics                          Anesthesia Physical Anesthesia Plan  ASA: II  Anesthesia Plan: General   Post-op Pain Management:    Induction: Intravenous  Airway Management Planned: Double Lumen EBT  Additional Equipment: Arterial line and CVP  Intra-op Plan:   Post-operative Plan: Possible Post-op intubation/ventilation  Informed Consent: I have reviewed the patients History and Physical, chart, labs and discussed the procedure including the risks, benefits and alternatives for the proposed anesthesia with the patient or authorized representative who has indicated his/her understanding and acceptance.   Dental advisory given  Plan Discussed with: CRNA and Anesthesiologist  Anesthesia Plan Comments:        Anesthesia Quick Evaluation

## 2013-02-24 NOTE — Brief Op Note (Addendum)
02/24/2013  11:12 AM  PATIENT:  Kristy Morrow  63 y.o. female  PRE-OPERATIVE DIAGNOSIS:  RUL LUNG MASS  POST-OPERATIVE DIAGNOSIS:  ADENOCARCINOMA RIGHT UPPER LOBE  PROCEDURE:   RIGHT VIDEO ASSISTED THORACOSCOPIC RIGHT UPPER LOBECTOMY MEDIASTINAL LYMPH NODE DISSECTION PLACEMENT of ON-Q LOCAL ANESTHETIC CATHETER  SURGEON:  Melrose Nakayama, MD  ASSISTANT: Suzzanne Cloud, PA-C  ANESTHESIA:   general  SPECIMEN:  Source of Specimen:  right upper lobe, lymph nodes  DISPOSITION OF SPECIMEN:  Pathology  DRAINS: 88 Fr CT, 32 Fr Bard drain  PATIENT CONDITION:  PACU - hemodynamically stable.   1.5 cm mass visible on inferior-posterior aspect of RUL- frozen= adenocarcinoma

## 2013-02-24 NOTE — Anesthesia Procedure Notes (Signed)
Procedure Name: Intubation Date/Time: 02/24/2013 7:48 AM Performed by: Carney Living Pre-anesthesia Checklist: Patient identified, Emergency Drugs available, Suction available, Patient being monitored and Timeout performed Patient Re-evaluated:Patient Re-evaluated prior to inductionOxygen Delivery Method: Circle system utilized Preoxygenation: Pre-oxygenation with 100% oxygen Intubation Type: IV induction Ventilation: Mask ventilation without difficulty Laryngoscope Size: Mac and 3 Grade View: Grade II Endobronchial tube: Double lumen EBT and Left and 35 Fr Number of attempts: 1 Airway Equipment and Method: Stylet Placement Confirmation: ETT inserted through vocal cords under direct vision,  positive ETCO2,  CO2 detector and breath sounds checked- equal and bilateral (fiberoptic scope) Secured at: 27 cm Tube secured with: Tape Dental Injury: Teeth and Oropharynx as per pre-operative assessment

## 2013-02-24 NOTE — H&P (View-Only) (Signed)
PCP is FULP, CAMMIE, MD Referring Provider is Antony Blackbird, MD  Chief Complaint  Patient presents with  . Lung Lesion    Surgical eval on RUL nodule, Chest CT 12/15/12, PET Scan 01/17/2013    HPI: Ms. Richins is a 63 year old woman with a past history of light tobacco abuse and ovarian cancer. In 2011 she had a CT of the chest as part of her followup for ovarian cancer. She was noted to have a groundglass opacity in the right upper lobe. She was referred to pulmonary and has been followed for this ever since. In October she had a CT which showed the lesion to be larger and also more solid in appearance. A PET CT was done earlier this month. The lesion was not hypermetabolic.  She states that she smoked one half a pack of cigarettes daily for 30 years until she quit in 2006. She denies any chest pain, pressure, or tightness. She denies shortness of breath. She denies cough, hemoptysis, and wheezing. Her weight has been stable. Her appetite is good. Overall she feels well.  ECOG/ZUBROD =0   Past Medical History  Diagnosis Date  . Unspecified essential hypertension   . Unspecified vitamin D deficiency   . Allergic rhinitis   . Pulmonary nodule, right     RUL 8-34m GGO noted Sept 2011. Stable march 2012  . COPD (chronic obstructive pulmonary disease) sept 2011    seen on CT. Gold stage 0 on PFT Oct 2011  . Ovarian cancer aug 2010    s/p TAH BSO. Dr NRobert Bellow Stage 1A low grade cystadenocarinoma    Past Surgical History  Procedure Laterality Date  . Exploratory laparotomy      Family History  Problem Relation Age of Onset  . Multiple myeloma Sister     Social History History  Substance Use Topics  . Smoking status: Former Smoker -- 0.50 packs/day for 30 years    Types: Cigarettes    Quit date: 02/17/2004  . Smokeless tobacco: Not on file  . Alcohol Use: Not on file    Current Outpatient Prescriptions  Medication Sig Dispense Refill  . Cholecalciferol (VITAMIN D) 1000 UNITS  capsule Take 1,000 Units by mouth daily.        .Marland Kitchenlosartan (COZAAR) 50 MG tablet Take 50 mg by mouth daily.        . metoprolol (TOPROL-XL) 50 MG 24 hr tablet Take 1 tablet by mouth Daily.      . Multiple Vitamin (MULTIVITAMIN) capsule Take 1 capsule by mouth daily.         No current facility-administered medications for this visit.    No Known Allergies  Review of Systems  Constitutional: Negative for fever, chills, diaphoresis, activity change, appetite change, fatigue and unexpected weight change.  Eyes:       Wears glasses, floaters  Respiratory: Negative for cough, shortness of breath and wheezing.   Cardiovascular: Negative for chest pain and leg swelling.  Neurological: Negative for weakness and headaches.  Hematological: Does not bruise/bleed easily.  All other systems reviewed and are negative.    BP 115/76  Pulse 64  Resp 18  Ht '5\' 2"'  (1.575 m)  Wt 145 lb (65.772 kg)  BMI 26.51 kg/m2  SpO2 98% Physical Exam  Vitals reviewed. Constitutional: She is oriented to person, place, and time. She appears well-developed and well-nourished. No distress.  HENT:  Head: Normocephalic and atraumatic.  Eyes: EOM are normal. Pupils are equal, round, and reactive to light.  Neck: Neck supple. No thyromegaly present.  Cardiovascular: Normal rate, regular rhythm, normal heart sounds and intact distal pulses.  Exam reveals no gallop and no friction rub.   No murmur heard. Pulmonary/Chest: Effort normal. She has no wheezes. She has no rales.  Abdominal: Soft. There is no tenderness.  Musculoskeletal: She exhibits no edema.  Lymphadenopathy:    She has no cervical adenopathy.  Neurological: She is alert and oriented to person, place, and time. No cranial nerve deficit.  No focal motor deficit  Skin: Skin is warm and dry.     Diagnostic Tests: CT of chest 12/15/2012 COMPARISON: 01/08/2012  FINDINGS:  The sub solid nodule is noted in the posterior right upper lobe  abutting  the major fissure appears larger and more dense on today's  study. Best estimation of size is approximately 1.5 x 1.3 cm on  image 16 of series 3. On the soft tissue windows, there does appear  to be a solid component measuring approximately 5-6 mm. Given the  change in the appearance of this nodule, would recommend biopsy or  surgical resection.  4 mm ground-glass area within the right apex is stable. Ill-defined  ground-glass density in the left upper lobe on image 14 measures  approximately 11 mm. When measured at the same level in the same  planes, this is stable. No new suspicious areas. No pleural  effusions.  No mediastinal, hilar, or axillary adenopathy. Heart is normal size.  Aorta is normal caliber. Chest wall soft tissues are unremarkable.  Imaging into the upper abdomen shows no acute findings. No acute  bony abnormality. Small sclerotic focus within a lower thoracic  vertebral body is stable, likely small bone island.  IMPRESSION:  Change in the appearance of the previously seen sub solid  ground-glass nodule in the posterior right upper lobe. This has  enlarged slightly in size and appears denser. There is approximately  5-6 mm solid component centrally seen on soft tissue windows. Given  this change and the small solid components, recommend biopsy or  surgical resection. With this appearance, PET CT would likely be  equivocal.  Electronically Signed  By: Rolm Baptise M.D.  On: 12/15/2012 16:11  PET/CT 01/17/2013 NUCLEAR MEDICINE PET SKULL BASE TO THIGH  FASTING BLOOD GLUCOSE: Value: 80m/dl  TECHNIQUE:  17.6 mCi F-18 FDG was injected intravenously. CT data was obtained  and used for attenuation correction and anatomic localization only.  (This was not acquired as a diagnostic CT examination.) Additional  exam technical data entered on technologist worksheet.  COMPARISON: Chest CT 12/15/2012.  FINDINGS:  NECK  No hypermetabolic lymph nodes in the neck.  CHEST   The previously noted sub solid nodule in the posterior aspect of the  right upper lobe currently measures approximately 14 x 12 mm (image  71 of series 2), with a central solid component estimated to measure  approximately 9 mm on today's examination. This lesion does not  demonstrate definite hypermetabolism (SUVmax = 1.9). Previously  described ground-glass attenuation nodules are otherwise not well  visualized on today's non breath-held CT examination. No  hypermetabolic mediastinal or hilar nodes.  ABDOMEN/PELVIS  No abnormal hypermetabolic activity within the liver, pancreas,  adrenal glands, or spleen. No hypermetabolic lymph nodes in the  abdomen or pelvis. Atherosclerosis throughout the abdominal and  pelvic vasculature, without definite aneurysm. No significant volume  of ascites, no pneumoperitoneum no pathologic distention small  status post hysterectomy ovaries are not confidently identified may  be surgically absent atrophic.  SKELETON  No focal hypermetabolic activity to suggest skeletal metastasis.  IMPRESSION:  1. Previously described subsolid nodule the posterior aspect of the  right upper lobe demonstrates no convincing hypermetabolism on  today's examination. However, the persistence of this nodule, and  the developing central solid component of the nodule are concerning  for slow-growing neoplasm such as adenocarcinoma, which can  demonstrate only low level metabolism on PET imaging. Accordingly,  further evaluation with biopsy or surgical resection may warrant  further consideration.  Electronically Signed  By: Vinnie Langton M.D.  On: 01/17/2013 10:33  Impression: 63 year old woman with a past history of tobacco abuse who has a right upper lobe nodule. This was first discovered in 2011. At that time it was a pure groundglass opacity. Over time this is increased in size and now has a soft tissue component as well. This is highly suspicious for a low-grade  adenocarcinoma, possibly an adenocarcinoma with lepidic growth. The negative PET does not rule out this possibility.  I reviewed the CT and PET scan with Ms. Pidcock. We discussed the differential diagnosis which includes cancer, as well as infectious or inflammatory etiologies. We discussed the option of biopsy, either CT-guided or bronchoscopic. We did discuss that a negative biopsy would not rule out cancer, and given the progression of the nodule on CT I would favor excisional biopsy for definitive diagnosis no matter what.  I recommended to her that we proceed with right VATS, wedge resection, possible right upper lobectomy. We discussed the general nature of the operation, the need for general anesthesia, incisions to be used, and the intraoperative decision making. We discussed the indications, risks, benefits, and alternatives. She understands the risks include, but are not limited to death, MI, DVT, PE, bleeding, possible need for transfusion, infection, prolonged air leak, cardiac arrhythmias, as well as the possibility of unforeseeable complications. She is a low risk surgical candidate. We also discussed the expected hospital stay and overall recovery.  She accepts the risk and wishes to proceed with surgery.  She does need pulmonary function testing prior surgery, although I do not expect at finding thing that would preclude operation.  She has a trip to [place] planned after New Years. 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  Plan: Pulmonary function testing with room air blood gas  Right VATS, wedge resection, possible right upper lobectomy on Thursday, January 9

## 2013-02-24 NOTE — Progress Notes (Signed)
Pt arrived from PACU, VSS, will continue to monitor.

## 2013-02-24 NOTE — Progress Notes (Signed)
IS given to pt and directed on use.  Pt able to return demonstration.  Pt able to pull 1000 on IS.  Encouraged use every hour.  Pt agreeable.

## 2013-02-24 NOTE — Preoperative (Signed)
Beta Blockers   Reason not to administer Beta Blockers:Not Applicable, Pt took Metoprolol at 05000 today

## 2013-02-24 NOTE — Progress Notes (Signed)
Utilization review completed. Keena Dinse, RN, BSN. 

## 2013-02-25 ENCOUNTER — Inpatient Hospital Stay (HOSPITAL_COMMUNITY): Payer: BC Managed Care – PPO

## 2013-02-25 LAB — BLOOD GAS, ARTERIAL
ACID-BASE DEFICIT: 1.3 mmol/L (ref 0.0–2.0)
Bicarbonate: 23 mEq/L (ref 20.0–24.0)
DRAWN BY: 29017
O2 CONTENT: 4 L/min
O2 SAT: 99.4 %
Patient temperature: 98.6
TCO2: 24.2 mmol/L (ref 0–100)
pCO2 arterial: 38.9 mmHg (ref 35.0–45.0)
pH, Arterial: 7.389 (ref 7.350–7.450)
pO2, Arterial: 149 mmHg — ABNORMAL HIGH (ref 80.0–100.0)

## 2013-02-25 LAB — BASIC METABOLIC PANEL
BUN: 10 mg/dL (ref 6–23)
CHLORIDE: 102 meq/L (ref 96–112)
CO2: 25 mEq/L (ref 19–32)
Calcium: 7.8 mg/dL — ABNORMAL LOW (ref 8.4–10.5)
Creatinine, Ser: 0.84 mg/dL (ref 0.50–1.10)
GFR calc Af Amer: 84 mL/min — ABNORMAL LOW (ref >90)
GFR calc non Af Amer: 72 mL/min — ABNORMAL LOW (ref >90)
GLUCOSE: 116 mg/dL — AB (ref 70–99)
Potassium: 3.8 mEq/L (ref 3.7–5.3)
Sodium: 136 mEq/L — ABNORMAL LOW (ref 137–147)

## 2013-02-25 LAB — CBC
HCT: 28.5 % — ABNORMAL LOW (ref 36.0–46.0)
HEMOGLOBIN: 9.2 g/dL — AB (ref 12.0–15.0)
MCH: 29.4 pg (ref 26.0–34.0)
MCHC: 32.3 g/dL (ref 30.0–36.0)
MCV: 91.1 fL (ref 78.0–100.0)
Platelets: 126 10*3/uL — ABNORMAL LOW (ref 150–400)
RBC: 3.13 MIL/uL — ABNORMAL LOW (ref 3.87–5.11)
RDW: 13.8 % (ref 11.5–15.5)
WBC: 7.1 10*3/uL (ref 4.0–10.5)

## 2013-02-25 MED ORDER — ENOXAPARIN SODIUM 40 MG/0.4ML ~~LOC~~ SOLN
40.0000 mg | SUBCUTANEOUS | Status: DC
  Administered 2013-02-25 – 2013-03-02 (×6): 40 mg via SUBCUTANEOUS
  Filled 2013-02-25 (×7): qty 0.4

## 2013-02-25 NOTE — Op Note (Signed)
NAMEWILLAMAE, DEMBY NO.:  [id number]  MEDICAL RECORD NO.:  [id number]  LOCATION:  3S12C                        FACILITY:  [facility]  PHYSICIAN:  Revonda Standard. Roxan Hockey, M.D.DATE OF BIRTH:  01-23-1951  DATE OF PROCEDURE:  02/24/2013 DATE OF DISCHARGE:                              OPERATIVE REPORT   PREOPERATIVE DIAGNOSIS:  Right upper lobe mass.  POSTOPERATIVE DIAGNOSIS:  Non-small-cell carcinoma, right upper lobe.  PROCEDURE:  Right video-assisted thoracoscopy, wedge resection, thoracoscopic right upper lobectomy, mediastinal lymph node dissection, ON-Q local anesthetic catheter placement.  SURGEON:  Revonda Standard. Roxan Hockey, M.D.  ASSISTANT:  Suzzanne Cloud, P.A.  ANESTHESIA:  General.  FINDINGS:  A 1.5 cm mass visible on the inferior visceral pleural surface of the right upper lobe.  Frozen section revealed adenocarcinoma, lobectomy performed, lymph nodes appeared grossly normal.  CLINICAL NOTE:  Mrs. Elling is a 63 year old woman with a past history of light tobacco abuse and ovarian cancer.  She had early stage ovarian cancer, but on followup of CTs in 2011 she was noted to have a ground- glass opacity in the right upper lobe.  Over time, this lesion had grown in size and become more solid in appearance.  The progression was more consistent with a primary lung lesion than a metastasis.  She was advised to undergo surgical resection.  Indications risks, benefits, and alternatives were discussed in detail with the patient.  She understood and accepted the risks and agreed to proceed.  OPERATIVE NOTE:  Mrs. Camarena was brought to the preoperative holding area on February 24, 2013.  There, Anesthesia placed a central line and arterial blood pressure monitoring line.  Intravenous antibiotics were administered.  She was taken to the operating room, anesthetized, and intubated with a double-lumen endotracheal tube.  Sequential compression devices were placed on the  calves for DVT prophylaxis.  A Foley catheter was placed.  She was placed in a left lateral decubitus position and the right chest was prepped and draped in the usual sterile fashion.  Single lung ventilation of the left lung was carried out, was tolerated well throughout the procedure.  First, a small incision was made in the right lateral chest in approximately the eighth intercostal space.  The chest was entered bluntly using a port and a 5 mm thoracoscope was placed in the chest. There was good isolation of the lung, but some persistent inflation of the lung. Suction was applied to the bronchial port and there was good deflation of the right lung with no cross ventilation.  A small utility incision approximately 5 cm in length was made in the fourth intercostal space anteriorly.  No rib spreading was performed during the procedure.  The upper right upper lobe was grasped. There was an abnormality of the visceral pleura on the inferior aspect of the right upper lobe, correlating to the nodule seen on CT scan.  A wedge resection was performed incorporating this lesion using sequential firings of an endoscopic GIA stapler.  The specimen was placed into an endoscopic retrieval bag and removed through the utility incision. It was sent for frozen section, which revealed adenocarcinoma.  Again given the appearance of this lesion and progression  over time being consistent with a primary lung cancer, the decision was made to proceed with a right upper lobectomy.  The inferior ligament was divided, there was a large paraesophageal node which was taken and sent as a separate specimen.  Dissection was carried into the major fissure.  The fissure was relatively incomplete at the confluence of the major minor fissures and this was left for later. The pleural reflection was divided posteriorly at the hilum.  Next, attention was turned to the hilum anteriorly and the pleural reflection was  divided.  The superior pulmonary vein was dissected out.  The middle lobe branch was identified and preserved, the upper lobe branches were encircled and divided with an endoscopic GIA stapler using a vascular cartridge.  Next, the anterior and apical pulmonary artery branches were identified.  These were then dissected out.  Lymph nodes were taken and sent as separate specimens.  The upper lobe branches were encircled and divided with an endoscopic vascular stapler.  Finally, the posterior ascending branch was identified higher up into the minor fissure.  This was likewise divided with a stapler.  Next, the major and minor fissures were dissected out and completed with staplers leaving the bronchus. A stapler was placed across the right upper lobe bronchus flush with its takeoff from the right mainstem and closed.  A test inflation showed good aeration of the lower and middle lobes.  The stapler was fired.  The right upper lobe was placed into an endoscopic retrieval bag and removed through the original incision.  An attempt was made to dissect out the subcarinal nodes.  The thoracic duct could be seen, and there was very little space between the thoracic duct and esophagus and the right mainstem and no obvious nodes in that area. No further dissection was carried out.  Level 10R and level 4R nodes then were dissected out.  The pleura was carefully incised above the azygous and a 2R node was taken as well.  All these nodes appeared grossly normal.  All of the nodes were sent for permanent pathology.  The chest was filled with saline and a test inflation showed no leak from the bronchial stump.  The middle lobe was tacked to the lower lobe with interrupted 3-0 Vicryl figure-of-eight sutures.  ProGEL was applied to 2 small areas of the parenchyma that were leaking air with the test inflation.  A separate chest tube incision was made and the original port incision was lengthened. A  32-French Blake drain was placed posteriorly and a 28-French chest tube was placed anteriorly, both were placed to the apex and secured with #1 silk sutures.  The utility incision was closed with #1 Vicryl fascial suture followed by a 2-0 Vicryl subcutaneous suture and 3-0 Vicryl subcuticular suture.  All sponge, needle, and instrument counts were correct in the procedure. The patient was taken from the operating room to the postanesthetic care unit extubated in good condition.     Revonda Standard Roxan Hockey, M.D.     SCH/MEDQ  D:  02/25/2013  T:  02/25/2013  Job:  [id number]

## 2013-02-25 NOTE — Progress Notes (Addendum)
[address]       [place], [id number]             [phone]          1 Day Post-Op Procedure(s) (LRB): VIDEO ASSISTED THORACOSCOPY (VATS)/WEDGE RESECTION RIGHT LUNG UPPER LOBECTOMY,NODE SAMPLING (Right)  Subjective: Comfortable, no complaints.   Objective: Vital signs in last 24 hours: Patient Vitals for the past 24 hrs:  BP Temp Temp src Pulse Resp SpO2 Height Weight  02/25/13 0356 100/40 mmHg 98.5 F (36.9 C) Oral 67 - 99 % - -  02/25/13 0046 99/43 mmHg 98.7 F (37.1 C) Oral 71 14 96 % - -  02/24/13 2019 92/52 mmHg 98.1 F (36.7 C) Oral 70 12 98 % - -  02/24/13 1700 - - - 72 16 99 % - -  02/24/13 1649 110/54 mmHg 98.3 F (36.8 C) Oral 79 19 97 % 5' 1.81" (1.57 m) 149 lb 14.6 oz (68 kg)  02/24/13 1600 107/50 mmHg 97.7 F (36.5 C) Oral 71 13 94 % - -  02/24/13 1500 90/43 mmHg 97.7 F (36.5 C) Oral 69 16 97 % - -  02/24/13 1445 96/47 mmHg - - 67 7 96 % - -  02/24/13 1430 86/54 mmHg - - 72 21 95 % - -  02/24/13 1415 93/43 mmHg - - 65 13 96 % - -  02/24/13 1400 94/48 mmHg - - 74 16 97 % - -  02/24/13 1345 99/49 mmHg 97.3 F (36.3 C) - 73 11 97 % - -  02/24/13 1330 97/47 mmHg - - 72 18 96 % - -  02/24/13 1315 - - - 73 19 94 % - -  02/24/13 1300 98/52 mmHg - - 75 17 90 % - -  02/24/13 1245 94/53 mmHg - - 76 14 90 % - -  02/24/13 1230 96/49 mmHg - - 79 15 96 % - -  02/24/13 1219 - - - - 12 99 % - -  02/24/13 1215 96/49 mmHg - - 74 11 98 % - -  02/24/13 1200 - - - 73 20 100 % - -  02/24/13 1155 - - - 70 15 98 % - -  02/24/13 1145 - - - 58 19 98 % - -  02/24/13 1138 72/44 mmHg 97.2 F (36.2 C) - 125 20 98 % - -   Current Weight  02/24/13 149 lb 14.6 oz (68 kg)     Intake/Output from previous day: 01/09 0701 - 01/10 0700 In: 3550 [P.O.:120; I.V.:3380; IV Piggyback:50] Out: 975 [Urine:475; Blood:100; Chest Tube:400]    PHYSICAL EXAM:  Heart: RRR Lungs: Clear Wound: Dressed and dry Chest tube: No air leak    Lab  Results: CBC: Recent Labs  02/22/13 1416 02/25/13 0650  WBC 5.2 7.1  HGB 12.0 9.2*  HCT 35.9* 28.5*  PLT 186 126*   BMET:  Recent Labs  02/22/13 1416 02/25/13 0650  NA 143 136*  K 4.2 3.8  CL 105 102  CO2 20 25  GLUCOSE 106* 116*  BUN 15 10  CREATININE 0.80 0.84  CALCIUM 9.3 7.8*    PT/INR:  Recent Labs  02/22/13 1416  LABPROT 13.4  INR 1.04   CXR: FINDINGS:  Two separate right chest tubes remain. No pneumothorax is  identified. There is stable volume loss of the right lung. Central  line positioning is stable in the SVC. No edema of or pleural fluid  is identified.  The heart size is stable and within normal limits.  IMPRESSION:  No pneumothorax.    Assessment/Plan: S/P Procedure(s) (LRB): VIDEO ASSISTED THORACOSCOPY (VATS)/WEDGE RESECTION RIGHT LUNG UPPER LOBECTOMY,NODE SAMPLING (Right) CXR stable, CT with no air leak.  Will leave both tubes to suction for now.   Mobilize, work on pulm toilet/IS, routine POD#1 progression. D/c a-line, leave Foley 1 more day until she is more mobile. BPs running low overnight, will hold home BP meds for now.   LOS: 1 day    COLLINS,GINA H 02/25/2013  Patient seen and examined, agree with above Will place CT to water seal

## 2013-02-26 ENCOUNTER — Inpatient Hospital Stay (HOSPITAL_COMMUNITY): Payer: BC Managed Care – PPO

## 2013-02-26 LAB — COMPREHENSIVE METABOLIC PANEL
ALK PHOS: 60 U/L (ref 39–117)
ALT: 35 U/L (ref 0–35)
AST: 40 U/L — ABNORMAL HIGH (ref 0–37)
Albumin: 3 g/dL — ABNORMAL LOW (ref 3.5–5.2)
BUN: 7 mg/dL (ref 6–23)
CALCIUM: 7.7 mg/dL — AB (ref 8.4–10.5)
CO2: 25 mEq/L (ref 19–32)
Chloride: 88 mEq/L — ABNORMAL LOW (ref 96–112)
Creatinine, Ser: 0.61 mg/dL (ref 0.50–1.10)
GFR calc non Af Amer: >90 mL/min (ref >90)
Glucose, Bld: 107 mg/dL — ABNORMAL HIGH (ref 70–99)
Potassium: 4.1 mEq/L (ref 3.7–5.3)
Sodium: 123 mEq/L — ABNORMAL LOW (ref 137–147)
TOTAL PROTEIN: 6.4 g/dL (ref 6.0–8.3)
Total Bilirubin: 0.5 mg/dL (ref 0.3–1.2)

## 2013-02-26 LAB — CBC
HEMATOCRIT: 30.1 % — AB (ref 36.0–46.0)
Hemoglobin: 10.2 g/dL — ABNORMAL LOW (ref 12.0–15.0)
MCH: 29.9 pg (ref 26.0–34.0)
MCHC: 33.9 g/dL (ref 30.0–36.0)
MCV: 88.3 fL (ref 78.0–100.0)
Platelets: 137 10*3/uL — ABNORMAL LOW (ref 150–400)
RBC: 3.41 MIL/uL — ABNORMAL LOW (ref 3.87–5.11)
RDW: 13 % (ref 11.5–15.5)
WBC: 7.3 10*3/uL (ref 4.0–10.5)

## 2013-02-26 MED ORDER — METOPROLOL SUCCINATE ER 50 MG PO TB24
50.0000 mg | ORAL_TABLET | Freq: Every day | ORAL | Status: DC
  Administered 2013-02-26 – 2013-03-02 (×5): 50 mg via ORAL
  Filled 2013-02-26 (×6): qty 1

## 2013-02-26 NOTE — Progress Notes (Addendum)
       [address]       [place] [address]             [phone]          2 Days Post-Op Procedure(s) (LRB): VIDEO ASSISTED THORACOSCOPY (VATS)/WEDGE RESECTION RIGHT LUNG UPPER LOBECTOMY,NODE SAMPLING (Right)  Subjective: Feels well, no complaints.   Objective: Vital signs in last 24 hours: Patient Vitals for the past 24 hrs:  BP Temp Temp src Pulse Resp SpO2  02/26/13 0311 156/72 mmHg 98 F (36.7 C) Oral 70 16 97 %  02/25/13 2311 152/63 mmHg 97.7 F (36.5 C) Oral 77 13 98 %  02/25/13 1940 145/81 mmHg 98 F (36.7 C) Oral 71 25 96 %  02/25/13 1551 - 97.8 F (36.6 C) Oral - - -  02/25/13 1543 125/60 mmHg - - 61 18 95 %  02/25/13 1219 - 98 F (36.7 C) Oral - - -  02/25/13 1214 114/55 mmHg - - 68 19 95 %   Current Weight  02/24/13 149 lb 14.6 oz (68 kg)     Intake/Output from previous day: 01/10 0701 - 01/11 0700 In: 2075 [P.O.:600; I.V.:1475] Out: 1421 [Urine:950; Stool:1; Chest Tube:470]    PHYSICAL EXAM:  Heart: RRR Lungs: Clear Wound: Clean and dry Chest tube: Some tidaling in column with cough but no definite air leak    Lab Results: CBC: Recent Labs  02/25/13 0650 02/26/13 0514  WBC 7.1 7.3  HGB 9.2* 10.2*  HCT 28.5* 30.1*  PLT 126* 137*   BMET:  Recent Labs  02/25/13 0650 02/26/13 0514  NA 136* 123*  K 3.8 4.1  CL 102 88*  CO2 25 25  GLUCOSE 116* 107*  BUN 10 7  CREATININE 0.84 0.61  CALCIUM 7.8* 7.7*    PT/INR: No results found for this basename: LABPROT, INR,  in the last 72 hours  CXR:  FINDINGS:  Two right chest tubes remain. Postop changes in the right hilum with  right hemithorax volume loss. No significant or enlarging  pneumothorax demonstrated. Right hemidiaphragm remains elevated.  Stable heart size and vascularity. Increased left lower lobe and  lingula streaky opacity, suspect atelectasis. Trachea is midline.  IMPRESSION:  Stable postoperative findings. No significant pneumothorax by plain    radiography.   Assessment/Plan: S/P Procedure(s) (LRB): VIDEO ASSISTED THORACOSCOPY (VATS)/WEDGE RESECTION RIGHT LUNG UPPER LOBECTOMY,NODE SAMPLING (Right)  CT output >400 ml/past 24 hrs,  CXR stable. Possibly can d/c 1 CT today.  Hyponatremia- Na low this am. She is eating and not using PCA.  Will saline lock IVF and d/c PCA. Follow labs in am.  HTN- BPs trending up. Will resume beta blocker.  Mobilize, continue pulm toilet, d/c Foley.   LOS: 2 days    COLLINS,GINA H 02/26/2013  Patient seen and examined, agree with above Dc anterior CT today

## 2013-02-27 ENCOUNTER — Encounter (HOSPITAL_COMMUNITY): Payer: Self-pay | Admitting: Thoracic Surgery (Cardiothoracic Vascular Surgery)

## 2013-02-27 ENCOUNTER — Inpatient Hospital Stay (HOSPITAL_COMMUNITY): Payer: BC Managed Care – PPO

## 2013-02-27 LAB — BASIC METABOLIC PANEL
BUN: 8 mg/dL (ref 6–23)
CALCIUM: 8.2 mg/dL — AB (ref 8.4–10.5)
CHLORIDE: 80 meq/L — AB (ref 96–112)
CO2: 23 meq/L (ref 19–32)
Creatinine, Ser: 0.46 mg/dL — ABNORMAL LOW (ref 0.50–1.10)
GFR calc Af Amer: >90 mL/min (ref >90)
GFR calc non Af Amer: >90 mL/min (ref >90)
Glucose, Bld: 88 mg/dL (ref 70–99)
POTASSIUM: 4 meq/L (ref 3.7–5.3)
SODIUM: 115 meq/L — AB (ref 137–147)

## 2013-02-27 NOTE — Progress Notes (Signed)
CRITICAL VALUE ALERT  Critical value received:  Na=115   Date of notification:  02/27/2013  Time of notification:  0707  Critical value read back:yes  Nurse who received alert:  Louie Casa  MD notified (1st page):  Suzzanne Cloud, PA  Time of first page:  [phone]  MD notified (2nd page):  Time of second page:  Responding MD:  Stefani Dama  Time MD responded:  [phone]

## 2013-02-27 NOTE — Progress Notes (Addendum)
       [address]       [place] [address]             [phone]          3 Days Post-Op Procedure(s) (LRB): VIDEO ASSISTED THORACOSCOPY (VATS)/WEDGE RESECTION RIGHT LUNG UPPER LOBECTOMY,NODE SAMPLING (Right)  Subjective: Comfortable, no complaints.  Pain controlled with po pain meds.  Did not walk yesterday.   Objective: Vital signs in last 24 hours: Patient Vitals for the past 24 hrs:  BP Temp Temp src Pulse Resp SpO2  02/27/13 0440 150/75 mmHg - - 68 15 96 %  02/27/13 0322 160/90 mmHg 97.7 F (36.5 C) Oral 71 12 98 %  02/27/13 0011 154/74 mmHg 98.2 F (36.8 C) Oral 70 19 94 %  02/26/13 2037 164/89 mmHg 98.2 F (36.8 C) Oral 69 17 97 %  02/26/13 1600 154/82 mmHg 97.6 F (36.4 C) Oral - - -  02/26/13 1152 160/86 mmHg 98 F (36.7 C) Oral - - -  02/26/13 0808 142/72 mmHg 99 F (37.2 C) Oral - 17 95 %   Current Weight  02/24/13 149 lb 14.6 oz (68 kg)     Intake/Output from previous day: 01/11 0701 - 01/12 0700 In: 1200 [P.O.:1200] Out: 2300 [Urine:2000; Chest Tube:300]    PHYSICAL EXAM:  Heart: RRR Lungs: Clear Wound: Clean and dry Chest tube: No air leak    Lab Results: CBC: Recent Labs  02/25/13 0650 02/26/13 0514  WBC 7.1 7.3  HGB 9.2* 10.2*  HCT 28.5* 30.1*  PLT 126* 137*   BMET:  Recent Labs  02/26/13 0514 02/27/13 0559  NA 123* 115*  K 4.1 4.0  CL 88* 80*  CO2 25 23  GLUCOSE 107* 88  BUN 7 8  CREATININE 0.61 0.46*  CALCIUM 7.7* 8.2*    PT/INR: No results found for this basename: LABPROT, INR,  in the last 72 hours  CXR:  Assessment/Plan: S/P Procedure(s) (LRB): VIDEO ASSISTED THORACOSCOPY (VATS)/WEDGE RESECTION RIGHT LUNG UPPER LOBECTOMY,NODE SAMPLING (Right)  CT output 300 ml/past 24 hrs.  CXR stable.  May need to leave CT one more day, but hopefully should be able to d/c soon.  Hyponatremia- Na decreased further today.  IVF had been d/c'ed.  Discussed with MD-Will fluid restrict and watch closely.   May need to resume IV NS, but presently she has no neuro sx's.  Mobilize today. Continue pulm toilet.   LOS: 3 days    COLLINS,GINA H 02/27/2013

## 2013-02-27 NOTE — Progress Notes (Signed)
Utilization review completed.  

## 2013-02-28 ENCOUNTER — Inpatient Hospital Stay (HOSPITAL_COMMUNITY): Payer: BC Managed Care – PPO

## 2013-02-28 LAB — BASIC METABOLIC PANEL
BUN: 9 mg/dL (ref 6–23)
BUN: 9 mg/dL (ref 6–23)
CHLORIDE: 79 meq/L — AB (ref 96–112)
CO2: 24 mEq/L (ref 19–32)
CO2: 21 mEq/L (ref 19–32)
Calcium: 8.2 mg/dL — ABNORMAL LOW (ref 8.4–10.5)
Calcium: 7.9 mg/dL — ABNORMAL LOW (ref 8.4–10.5)
Chloride: 77 mEq/L — ABNORMAL LOW (ref 96–112)
Creatinine, Ser: 0.44 mg/dL — ABNORMAL LOW (ref 0.50–1.10)
Creatinine, Ser: 0.51 mg/dL (ref 0.50–1.10)
GFR calc Af Amer: >90 mL/min (ref >90)
GFR calc non Af Amer: >90 mL/min (ref >90)
Glucose, Bld: 100 mg/dL — ABNORMAL HIGH (ref 70–99)
Glucose, Bld: 108 mg/dL — ABNORMAL HIGH (ref 70–99)
POTASSIUM: 4.4 meq/L (ref 3.7–5.3)
Potassium: 4.2 mEq/L (ref 3.7–5.3)
SODIUM: 112 meq/L — AB (ref 137–147)
Sodium: 114 mEq/L — CL (ref 137–147)

## 2013-02-28 MED ORDER — LOSARTAN POTASSIUM 50 MG PO TABS
50.0000 mg | ORAL_TABLET | Freq: Every day | ORAL | Status: DC
  Administered 2013-02-28 – 2013-03-02 (×3): 50 mg via ORAL
  Filled 2013-02-28 (×5): qty 1

## 2013-02-28 MED ORDER — ACETAMINOPHEN 325 MG PO TABS
650.0000 mg | ORAL_TABLET | Freq: Four times a day (QID) | ORAL | Status: DC | PRN
  Administered 2013-02-28 – 2013-03-02 (×4): 650 mg via ORAL
  Filled 2013-02-28 (×4): qty 2

## 2013-02-28 MED ORDER — FUROSEMIDE 10 MG/ML IJ SOLN
40.0000 mg | Freq: Once | INTRAMUSCULAR | Status: AC
  Administered 2013-02-28: 40 mg via INTRAVENOUS
  Filled 2013-02-28: qty 4

## 2013-02-28 MED ORDER — SODIUM CHLORIDE 0.9 % IV SOLN
INTRAVENOUS | Status: DC
  Administered 2013-02-28 (×2): via INTRAVENOUS

## 2013-02-28 NOTE — Progress Notes (Signed)
Feels better this afternoon  BP 107/63  Pulse 79  Temp(Src) 98 F (36.7 C) (Oral)  Resp 15  Ht 5' 1.81" (1.57 m)  Wt 149 lb 14.6 oz (68 kg)  BMI 27.59 kg/m2  SpO2 96%  Na up to 114 from 112  Continue saline- will give lasix again this PM

## 2013-02-28 NOTE — Progress Notes (Signed)
CRITICAL VALUE ALERT  Critical value received:  Na 112  Date of notification:  02/28/2013  Time of notification:  0525  Critical value read back:yes  Nurse who received alert:  Corie Chiquito  MD notified (1st page):  Roxy Manns  Time of first page:  0530  MD notified (2nd page):  Time of second page:  Responding MD:  Roxy Manns  Time MD responded:  [phone]

## 2013-02-28 NOTE — Progress Notes (Addendum)
       [address]       [place] [address]             [phone]          4 Days Post-Op Procedure(s) (LRB): VIDEO ASSISTED THORACOSCOPY (VATS)/WEDGE RESECTION RIGHT LUNG UPPER LOBECTOMY,NODE SAMPLING (Right)  Subjective: Not feeling as well this am.  Nauseated, not eating, fatigued.  Breathing stable.  Denies dizziness, headaches.   Objective: Vital signs in last 24 hours: Patient Vitals for the past 24 hrs:  BP Temp Temp src Pulse Resp SpO2  02/28/13 0735 133/76 mmHg - - 82 17 100 %  02/28/13 0425 138/72 mmHg 97.6 F (36.4 C) Oral 73 16 99 %  02/27/13 2340 166/79 mmHg 97.7 F (36.5 C) Oral 67 14 100 %  02/27/13 1900 - 98.4 F (36.9 C) Oral - - -  02/27/13 1855 135/76 mmHg - - 67 11 99 %  02/27/13 1600 152/69 mmHg 97.7 F (36.5 C) Oral 67 13 99 %  02/27/13 1146 135/69 mmHg 98.4 F (36.9 C) Oral - - -  02/27/13 1031 - - - 70 - -  02/27/13 0817 125/63 mmHg 98 F (36.7 C) Oral - - -   Current Weight  02/24/13 149 lb 14.6 oz (68 kg)     Intake/Output from previous day: 01/12 0701 - 01/13 0700 In: 240 [P.O.:240] Out: 90 [Chest Tube:90]    PHYSICAL EXAM:  Heart: RRR Lungs: Clear Wound: Clean and dry Chest tube: No air leak    Lab Results: CBC: Recent Labs  02/26/13 0514  WBC 7.3  HGB 10.2*  HCT 30.1*  PLT 137*   BMET:  Recent Labs  02/27/13 0559 02/28/13 0434  NA 115* 112*  K 4.0 4.4  CL 80* 77*  CO2 23 24  GLUCOSE 88 100*  BUN 8 9  CREATININE 0.46* 0.44*  CALCIUM 8.2* 8.2*    PT/INR: No results found for this basename: LABPROT, INR,  in the last 72 hours  CXR: stable, no ptx  Assessment/Plan: S/P Procedure(s) (LRB): VIDEO ASSISTED THORACOSCOPY (VATS)/WEDGE RESECTION RIGHT LUNG UPPER LOBECTOMY,NODE SAMPLING (Right) CT no air leak and minimal output.  Hopefully can d/c CT today. Hyponatremia- Na down further today despite fluid restriction.  Po intake poor.  Will restart IVF and watch closely.  No neuro  sx's.    LOS: 4 days    COLLINS,GINA H 02/28/2013  Patient seen and examined, agree with above CT output down- dc CT Path still pending Daughter says she has been pushing patient to drink lots of water- suspect that is a contibutor but probably a minor factor in hyponatremia Dc central line

## 2013-03-01 ENCOUNTER — Inpatient Hospital Stay (HOSPITAL_COMMUNITY): Payer: BC Managed Care – PPO

## 2013-03-01 DIAGNOSIS — E871 Hypo-osmolality and hyponatremia: Secondary | ICD-10-CM

## 2013-03-01 DIAGNOSIS — C3490 Malignant neoplasm of unspecified part of unspecified bronchus or lung: Secondary | ICD-10-CM

## 2013-03-01 DIAGNOSIS — R222 Localized swelling, mass and lump, trunk: Secondary | ICD-10-CM

## 2013-03-01 LAB — BASIC METABOLIC PANEL
BUN: 11 mg/dL (ref 6–23)
BUN: 9 mg/dL (ref 6–23)
CHLORIDE: 86 meq/L — AB (ref 96–112)
CO2: 22 meq/L (ref 19–32)
CO2: 25 mEq/L (ref 19–32)
Calcium: 7.9 mg/dL — ABNORMAL LOW (ref 8.4–10.5)
Calcium: 8.6 mg/dL (ref 8.4–10.5)
Chloride: 79 mEq/L — ABNORMAL LOW (ref 96–112)
Creatinine, Ser: 0.53 mg/dL (ref 0.50–1.10)
Creatinine, Ser: 0.57 mg/dL (ref 0.50–1.10)
GFR calc Af Amer: >90 mL/min (ref >90)
GFR calc non Af Amer: >90 mL/min (ref >90)
GFR calc non Af Amer: >90 mL/min (ref >90)
Glucose, Bld: 90 mg/dL (ref 70–99)
Glucose, Bld: 97 mg/dL (ref 70–99)
Potassium: 3.4 mEq/L — ABNORMAL LOW (ref 3.7–5.3)
Potassium: 3.6 mEq/L — ABNORMAL LOW (ref 3.7–5.3)
SODIUM: 116 meq/L — AB (ref 137–147)
Sodium: 124 mEq/L — ABNORMAL LOW (ref 137–147)

## 2013-03-01 LAB — PROCALCITONIN: Procalcitonin: <0.1 ng/mL

## 2013-03-01 MED ORDER — ONDANSETRON HCL 4 MG/2ML IJ SOLN
4.0000 mg | Freq: Four times a day (QID) | INTRAMUSCULAR | Status: DC | PRN
  Administered 2013-03-01: 4 mg via INTRAVENOUS
  Filled 2013-03-01: qty 2

## 2013-03-01 MED ORDER — FUROSEMIDE 10 MG/ML IJ SOLN
40.0000 mg | Freq: Once | INTRAMUSCULAR | Status: AC
  Administered 2013-03-01: 40 mg via INTRAVENOUS
  Filled 2013-03-01: qty 4

## 2013-03-01 MED ORDER — POTASSIUM CHLORIDE ER 10 MEQ PO TBCR
20.0000 meq | EXTENDED_RELEASE_TABLET | Freq: Once | ORAL | Status: DC
  Filled 2013-03-01: qty 2

## 2013-03-01 MED ORDER — POTASSIUM CHLORIDE CRYS ER 10 MEQ PO TBCR
40.0000 meq | EXTENDED_RELEASE_TABLET | Freq: Two times a day (BID) | ORAL | Status: DC
  Administered 2013-03-02: 40 meq via ORAL
  Filled 2013-03-01 (×3): qty 4

## 2013-03-01 MED ORDER — METOCLOPRAMIDE HCL 5 MG/ML IJ SOLN
10.0000 mg | Freq: Three times a day (TID) | INTRAMUSCULAR | Status: DC | PRN
  Administered 2013-03-01: 10 mg via INTRAVENOUS
  Filled 2013-03-01: qty 2

## 2013-03-01 MED ORDER — LEVOFLOXACIN 500 MG PO TABS
500.0000 mg | ORAL_TABLET | Freq: Every day | ORAL | Status: DC
  Administered 2013-03-01 – 2013-03-02 (×2): 500 mg via ORAL
  Filled 2013-03-01 (×3): qty 1

## 2013-03-01 MED ORDER — POTASSIUM CHLORIDE CRYS ER 20 MEQ PO TBCR
40.0000 meq | EXTENDED_RELEASE_TABLET | Freq: Two times a day (BID) | ORAL | Status: DC
  Administered 2013-03-01: 40 meq via ORAL
  Filled 2013-03-01 (×2): qty 2

## 2013-03-01 MED ORDER — POTASSIUM CHLORIDE CRYS ER 20 MEQ PO TBCR: 20.0000 meq | EXTENDED_RELEASE_TABLET | Freq: Two times a day (BID) | ORAL | Status: DC

## 2013-03-01 NOTE — Progress Notes (Addendum)
       [address]       ,[place] [address]             [phone]          5 Days Post-Op Procedure(s) (LRB): VIDEO ASSISTED THORACOSCOPY (VATS)/WEDGE RESECTION RIGHT LUNG UPPER LOBECTOMY,NODE SAMPLING (Right)  Subjective: No new complaints.  Walked in hall this am, breathing stable, no neuro sx's.   Objective: Vital signs in last 24 hours: Patient Vitals for the past 24 hrs:  BP Temp Temp src Pulse Resp SpO2  03/01/13 0651 119/62 mmHg - - 80 17 97 %  03/01/13 0420 134/60 mmHg 98.4 F (36.9 C) Oral 76 15 98 %  02/28/13 2350 126/62 mmHg 98 F (36.7 C) Oral 81 15 98 %  02/28/13 1955 127/72 mmHg 98.3 F (36.8 C) Oral 85 17 99 %  02/28/13 1537 107/63 mmHg 98 F (36.7 C) Oral 79 15 96 %  02/28/13 1500 - 98 F (36.7 C) Oral - - -  02/28/13 1147 146/71 mmHg 97.9 F (36.6 C) Oral 70 12 98 %   Current Weight  02/24/13 149 lb 14.6 oz (68 kg)     Intake/Output from previous day: 01/13 0701 - 01/14 0700 In: 2420 [P.O.:820; I.V.:1600] Out: 176 [Urine:150; Stool:1; Chest Tube:25]    PHYSICAL EXAM:  Heart: RRR Lungs: Clear Wound: Clean and dry     Lab Results: CBC:No results found for this basename: WBC, HGB, HCT, PLT,  in the last 72 hours BMET:  Recent Labs  02/28/13 1546 03/01/13 0250  NA 114* 116*  K 4.2 3.4*  CL 79* 79*  CO2 21 22  GLUCOSE 108* 90  BUN 9 11  CREATININE 0.51 0.53  CALCIUM 7.9* 7.9*    PT/INR: No results found for this basename: LABPROT, INR,  in the last 72 hours  CXR: FINDINGS:  Two views of the chest were obtained. Elevation of the right  hemidiaphragm related to right chest surgery. Few densities in the  right hilum and right upper lung may represent postsurgical changes  or atelectasis. There appears to be a tiny right pneumothorax. Small  amount of subcutaneous gas in the right lower chest. Left lung has a  few basilar densities that most likely represent atelectasis. Heart  size is within normal limits.  No definite pleural effusions.  IMPRESSION:  There may be a tiny right pneumothorax.  Few patchy densities in the right upper lung are likely related to  atelectasis and postsurgical changes.   Assessment/Plan: S/P Procedure(s) (LRB): VIDEO ASSISTED THORACOSCOPY (VATS)/WEDGE RESECTION RIGHT LUNG UPPER LOBECTOMY,NODE SAMPLING (Right) Hyponatremia- Na slowly improving.  UOP not documented,but pt and RN note that she is diuresing well.  Continue saline repletion and watch.  May need to repeat IV Lasix as well. Hypokalemia- Rx po K+ today. CXR stable after CT removed.  Continue current care. Home when Na normalizes.   LOS: 5 days    COLLINS,GINA H 03/01/2013  Patient seen and examined, agree with above Will dc IVF Supplement K

## 2013-03-01 NOTE — Consult Note (Addendum)
PULMONARY  / CRITICAL CARE MEDICINE  Name: Kristy Morrow MRN: [id number] DOB: 21-May-1950 PCP Antony Blackbird, MD    ADMISSION DATE:  02/24/2013  LOS 5 days   CONSULTATION DATE:  03/01/13  REFERRING MD :  Dr Vernia Buff PRIMARY SERVICE: [facility]  CHIEF COMPLAINT:  Acute hyponatremia  BRIEF PATIENT DESCRIPTION: Post op hyponatremia  SIGNIFICANT EVENTS / STUDIES:  02/24/2013 - Right uper lobectomy NSCLC    LINES / TUBES: none  CULTURES: Results for orders placed during the hospital encounter of 02/22/13  SURGICAL PCR SCREEN     Status: Abnormal   Collection Time    02/22/13  2:22 PM      Result Value Range Status   MRSA, PCR NEGATIVE  NEGATIVE Final   Staphylococcus aureus POSITIVE (*) NEGATIVE Final   Comment:            The Xpert SA Assay (FDA     approved for NASAL specimens     in patients over 36 years of age),     is one component of     a comprehensive surveillance     program.  Test performance has     been validated by [employer] for patients greater     than or equal to 5 year old.     It is not intended     to diagnose infection nor to     guide or monitor treatment.     ANTIBIOTICS: Anti-infectives   Start     Dose/Rate Route Frequency Ordered Stop   02/24/13 2000  cefUROXime (ZINACEF) 1.5 g in dextrose 5 % 50 mL IVPB     1.5 g 100 mL/hr over 30 Minutes Intravenous Every 12 hours 02/24/13 1648 02/25/13 0830   02/24/13 0600  cefUROXime (ZINACEF) 1.5 g in dextrose 5 % 50 mL IVPB     1.5 g 100 mL/hr over 30 Minutes Intravenous 60 min pre-op 02/23/13 1442 02/24/13 0813       HISTORY OF PRESENT ILLNESS:   Office patient of Dr Chase Caller. Has hx of Ovarian cancer and s/p TAH BSO in 2010. Then RUL nodule sotted sept 20111 and has been stable till end 2014. Then nodule changed in character. Underwent RULobectomy 02/25/13 and diagnosed with NSCLC Stage 1A. Post operative was doing well but on 02/26/13 developed sore throat, gagging and some vomitting  and since then cough with yellow sputum. RN thinks also immediate post op got hypotonic saline. No hx of diuretic use. However, on 02/26/13 POD#1 Na dropped to 123 and following day to 115 and then on 02/28/13 to 112 (drop despite saline lock x 24h). STarted on normal saline at 100cc/h with lasix 72m IV Q24h 02/28/13 and on 03/01/13 Na increased to 116 (up by 488m in 24h)  She has had no seizures, coma, confusion but has had nausea, and some unsteady gait as a result of low Na  PCCM consulted for low Na mgmt   PAST MEDICAL HISTORY :  Past Medical History  Diagnosis Date  . Unspecified essential hypertension   . Unspecified vitamin D deficiency   . Allergic rhinitis   . Pulmonary nodule, right     RUL [id number] noted Sept 2011. Stable march 2012  . COPD (chronic obstructive pulmonary disease) sept 2011    seen on CT. Gold stage 0 on PFT Oct 2011  . Ovarian cancer aug 2010    s/p TAH BSO. Dr NycRobert Bellowtage 1A low grade cystadenocarinoma  Family History  Problem Relation Age of Onset  . Multiple myeloma Sister      History   Social History  . Marital Status: Single    Spouse Name: N/A    Number of Children: 2  . Years of Education: N/A   Occupational History  . Glass blower/designer    Social History Main Topics  . Smoking status: Former Smoker -- 0.50 packs/day for 30 years    Types: Cigarettes    Quit date: 02/17/2004  . Smokeless tobacco: Not on file  . Alcohol Use: No  . Drug Use: No  . Sexual Activity: Not on file   Other Topics Concern  . Not on file   Social History Narrative  . No narrative on file     No Known Allergies    (Not in an outpatient encounter)     REVIEW OF SYSTEMS:    11 point ROS per HPI otherwise negative  SUBJECTIVE:   VITAL SIGNS: Filed Vitals:   02/28/13 2350 03/01/13 0420 03/01/13 0651 03/01/13 0800  BP: 126/62 134/60 119/62 141/63  Pulse: 81 76 80 82  Temp: 98 F (36.7 C) 98.4 F (36.9 C)  97.6 F (36.4 C)  TempSrc: Oral  Oral  Axillary  Resp: _0 Height:      Weight:      SpO2: 98% 98% 97% 98%     SUPINE 129/59, HR 90 STANDING 110/52, HR 86  INTAKE / OUTPUT: I/O last 3 completed shifts: In: 2660 [P.O.:1060; I.V.:1600] Out: 59 [Urine:150; Stool:1; Chest Tube:115]     PHYSICAL EXAMINATION: General:  Looks well. Sitting and talking Neuro:  AXOx3. Speech normal;. Movesl all4s. RN reports walking around hallway with walker without probelsm HEENT:  Dry mouth. No thrush Cardiovascular:  HR81. Looks euvolemic. S1S@+. No murmurs Lungs:  CTA bilaterally. No distress Abdomen:  Soft. Non tender. No organomegaly Musculoskeletal:  No cyanosis. No clubbing. No edema Skin:  intact  LABS: PULMONARY  Recent Labs Lab 02/22/13 1415 02/25/13 0648  PHART 7.487* 7.389  PCO2ART 31.6* 38.9  PO2ART 82.6 149.0*  HCO3 23.7 23.0  TCO2 24.7 24.2  O2SAT 97.6 99.4    CBC  Recent Labs Lab 02/22/13 1416 02/25/13 0650 02/26/13 0514  HGB 12.0 9.2* 10.2*  HCT 35.9* 28.5* 30.1*  WBC 5.2 7.1 7.3  PLT 186 126* 137*    COAGULATION  Recent Labs Lab 02/22/13 1416  INR 1.04    CARDIAC  No results found for this basename: TROPONINI,  in the last 168 hours No results found for this basename: PROBNP,  in the last 168 hours   CHEMISTRY  Recent Labs Lab 02/26/13 0514 02/27/13 0559 02/28/13 0434 02/28/13 1546 03/01/13 0250  NA 123* 115* 112* 114* 116*  K 4.1 4.0 4.4 4.2 3.4*  CL 88* 80* 77* 79* 79*  CO2 _1 GLUCOSE 107* 88 100* 108* 90  BUN _2 CREATININE 0.61 0.46* 0.44* 0.51 0.53  CALCIUM 7.7* 8.2* 8.2* 7.9* 7.9*   Estimated Creatinine Clearance: 64.8 ml/min (by C-G formula based on Cr of 0.53).   LIVER  Recent Labs Lab 02/22/13 1416 02/26/13 0514  AST 22 40*  ALT 17 35  ALKPHOS 62 60  BILITOT 0.3 0.5  PROT 7.1 6.4  ALBUMIN 3.7 3.0*  INR 1.04  --      INFECTIOUS No results found for this basename: LATICACIDVEN, PROCALCITON,  in the last 168  hours  ENDOCRINE CBG (last 3)  No results found for this basename: GLUCAP,  in the last 72 hours       IMAGING x48h  Dg Chest 2 View  03/01/2013   CLINICAL DATA:  History of lung cancer and postop VATS.  EXAM: CHEST  2 VIEW  COMPARISON:  02/28/2013  FINDINGS: Two views of the chest were obtained. Elevation of the right hemidiaphragm related to right chest surgery. Few densities in the right hilum and right upper lung may represent postsurgical changes or atelectasis. There appears to be a tiny right pneumothorax. Small amount of subcutaneous gas in the right lower chest. Left lung has a few basilar densities that most likely represent atelectasis. Heart size is within normal limits. No definite pleural effusions.  IMPRESSION: There may be a tiny right pneumothorax.  Few patchy densities in the right upper lung are likely related to atelectasis and postsurgical changes.   Electronically Signed   By: Markus Daft M.D.   On: 03/01/2013 07:35   Dg Chest Port 1 View  02/28/2013   CLINICAL DATA:  Chest tube removal  EXAM: PORTABLE CHEST - 1 VIEW  COMPARISON:  Study obtained earlier in the day  FINDINGS: Right chest tube as well as central catheter have been removed. There is no appreciable pneumothorax.  There is patchy infiltrate in the right upper lobe. Lungs are otherwise clear. Heart size and pulmonary vascularity are normal. No adenopathy.  IMPRESSION: No demonstrable pneumothorax. Patchy infiltrate right upper lobe present.   Electronically Signed   By: Lowella Grip M.D.   On: 02/28/2013 10:59   Dg Chest Port 1 View  02/28/2013   CLINICAL DATA:  History of right upper lobectomy for lung malignancy  EXAM: PORTABLE CHEST - 1 VIEW  COMPARISON:  Portable chest x-ray of February 27, 2013.  FINDINGS: The tiny right-sided pneumothorax is not evident today. The large caliber right-sided chest tube is unchanged in position. A small amount of subcutaneous emphysema in the inferior aspect of the right  axillary region is again demonstrated. The right lung is mildly hypoinflated with apparent elevation of the right hemidiaphragm as compared to the left. This is a stable finding. The interstitial markings of both lungs remain mildly increased. This is most conspicuous on the right in the perihilar region. The cardiopericardial silhouette is mildly enlarged though stable. The pulmonary vascularity is not clearly engorged. There is a right subclavian venous catheter in place whose tip lies in the region of the proximal SVC. No significant pleural effusion is demonstrated.  IMPRESSION: 1. The tiny right apical pneumothorax is not evident today. The chest tube is unchanged in appearance. The right lung is fairly stable in appearance as well. 2. On the left there is no evidence of infiltrate or significant atelectasis. The cardiac silhouette is top-normal in size though stable without significant pulmonary vascular congestion.   Electronically Signed   By: David  Martinique   On: 02/28/2013 07:55       ASSESSMENT / PLAN:  PULMONARY A s/p RUL lobectomy 02/25/13 for Stage 1A NSCLC. Doing well P:   Per CVTS  CARDIOVASCULAR A: Nil acute P:  Monitor  RENAL A:  Acute Post op hyponatremia. Finally improving after stating N Saline 02/28/13. Orthostat Suspect post op hypotonic saline, dehydration  Also possible post op pneumonia as cause  P:   Continue current Rx of normal saline with lasix  Do not let Na increase more than 6 meq in 24h  GASTROINTESTINAL A:  Nausea related  to low Na P:   REglan prn  HEMATOLOGIC A:  Post op anemia P:  PRBC for hgb < 7gm%   INFECTIOUS A:  Possible pneumonia. MSSSA PCR positive P:   Start po levaquin Check PCT  ENDOCRINE A:  Nil acute P:   monitor  NEUROLOGIC A:  unsteadly gait due to low Na P:   PT consult Monitor progress with Na correction  TODAY'S SUMMARY:  Daughter and patient updated   Dr. Brand Males, M.D., [facility].C.P Pulmonary and  Critical Care Medicine Staff Physician [facility] Pager: [phone], If no answer or between  15:00h - 7:00h: call [phone]  03/01/2013 9:44 AM

## 2013-03-01 NOTE — Progress Notes (Signed)
C/o nausea, reglan given, Dr Joya Gaskins in [place] notified of new labs

## 2013-03-01 NOTE — Progress Notes (Signed)
CRITICAL VALUE ALERT  Critical value received:  Na 116  Date of notification:  03/01/2013  Time of notification:  0507  Critical value read back:yes  Nurse who received alert:  Sophronia Simas  MD notified (1st page):  Recurrent issue, MD aware.  Time of first page:    MD notified (2nd page):  Time of second page:  Responding MD:    Time MD responded:

## 2013-03-02 ENCOUNTER — Other Ambulatory Visit: Payer: Self-pay | Admitting: *Deleted

## 2013-03-02 DIAGNOSIS — E871 Hypo-osmolality and hyponatremia: Secondary | ICD-10-CM

## 2013-03-02 LAB — BASIC METABOLIC PANEL
BUN: 10 mg/dL (ref 6–23)
CO2: 22 mEq/L (ref 19–32)
CREATININE: 0.62 mg/dL (ref 0.50–1.10)
Calcium: 8.6 mg/dL (ref 8.4–10.5)
Chloride: 90 mEq/L — ABNORMAL LOW (ref 96–112)
GFR calc Af Amer: >90 mL/min (ref >90)
GFR calc non Af Amer: >90 mL/min (ref >90)
Glucose, Bld: 108 mg/dL — ABNORMAL HIGH (ref 70–99)
Potassium: 3.9 mEq/L (ref 3.7–5.3)
Sodium: 128 mEq/L — ABNORMAL LOW (ref 137–147)

## 2013-03-02 LAB — PRO B NATRIURETIC PEPTIDE: PRO B NATRI PEPTIDE: 530 pg/mL — AB (ref 0–125)

## 2013-03-02 LAB — PROCALCITONIN

## 2013-03-02 LAB — MAGNESIUM: Magnesium: 2.1 mg/dL (ref 1.5–2.5)

## 2013-03-02 LAB — PHOSPHORUS: PHOSPHORUS: 1.4 mg/dL — AB (ref 2.3–4.6)

## 2013-03-02 MED ORDER — POTASSIUM PHOSPHATE DIBASIC 3 MMOLE/ML IV SOLN
24.0000 mmol | Freq: Once | INTRAVENOUS | Status: AC
  Administered 2013-03-02: 24 mmol via INTRAVENOUS
  Filled 2013-03-02: qty 8

## 2013-03-02 MED ORDER — GUAIFENESIN ER 600 MG PO TB12
600.0000 mg | ORAL_TABLET | Freq: Two times a day (BID) | ORAL | Status: DC
  Administered 2013-03-02: 600 mg via ORAL
  Filled 2013-03-02 (×3): qty 1

## 2013-03-02 MED ORDER — LEVALBUTEROL HCL 1.25 MG/0.5ML IN NEBU
1.2500 mg | INHALATION_SOLUTION | Freq: Four times a day (QID) | RESPIRATORY_TRACT | Status: DC | PRN
  Filled 2013-03-02: qty 0.5

## 2013-03-02 MED ORDER — POTASSIUM CHLORIDE CRYS ER 20 MEQ PO TBCR
40.0000 meq | EXTENDED_RELEASE_TABLET | Freq: Once | ORAL | Status: AC
Start: 2013-03-02 — End: 2013-03-02
  Administered 2013-03-02: 40 meq via ORAL

## 2013-03-02 MED ORDER — LEVALBUTEROL HCL 1.25 MG/0.5ML IN NEBU
1.2500 mg | INHALATION_SOLUTION | Freq: Four times a day (QID) | RESPIRATORY_TRACT | Status: DC
  Administered 2013-03-02: 1.25 mg via RESPIRATORY_TRACT
  Filled 2013-03-02 (×3): qty 0.5

## 2013-03-02 NOTE — Evaluation (Signed)
Physical Therapy Evaluation Patient Details Name: Kristy Morrow MRN: [id number] DOB: 1950/04/09 Today's Date: 03/02/2013 Time: 1448-1856 PT Time Calculation (min): 14 min  PT Assessment / Plan / Recommendation History of Present Illness  Ms. Kristy Morrow is a 63 year old woman with a past history of light tobacco abuse and ovarian cancer. In 2011 she had a CT of the chest as part of her followup for ovarian cancer. She was noted to have a groundglass opacity in the right upper lobe. She was referred to pulmonary and has been followed for this ever since. In October she had a CT which showed the lesion to be larger and also more solid in appearance. A PET CT was done earlier this month. The lesion was not hypermetabolic. pt is s/p video assisted thoracoscopy/wedge resection.   Clinical Impression  Pt adm due to the above. Presents with limitations in independence with functional mobility at this time. Will benefit from skilled acute PT to address deficits listed below (see PT problem list) and ensure pt is mod I for mobility prior to returning home, where she lives alone. Pt HR reached 141 with ambulating; was 115 at rest. Pt fatigued quickly with ambulation. Recommended to ambulate 2-3x's per day with nursing.   PT Assessment  Patient needs continued PT services    Follow Up Recommendations  No PT follow up;Supervision - Intermittent    Does the patient have the potential to tolerate intense rehabilitation      Barriers to Discharge Decreased caregiver support pt lives alone; will need to be mod I to d/c home alone    Equipment Recommendations  None recommended by PT    Recommendations for Other Services     Frequency Min 3X/week    Precautions / Restrictions Precautions Precautions: Fall Precaution Comments: reports she "trips and falls a lot" Restrictions Weight Bearing Restrictions: No   Pertinent Vitals/Pain HR max at 141 with activity; at rest 115      Mobility  Bed  Mobility General bed mobility comments: not assessed; pt sitting EOB  Transfers Overall transfer level: Needs assistance Equipment used: None Transfers: Sit to/from Stand Sit to Stand: Min guard General transfer comment: min guard to steady and for safety  Ambulation/Gait Ambulation/Gait assistance: Min guard Ambulation Distance (Feet): 300 Feet Assistive device: None Gait Pattern/deviations: Step-through pattern;Narrow base of support;Decreased stride length Gait velocity: very decreased; guarded  Gait velocity interpretation: Below normal speed for age/gender General Gait Details: min guard to steady; pt slightly unsteady during ambulation; no LOB noted but pt did feel "uneasy" with ambulating; very anxious and fearful with ambulation; reported her legs were very fatigued with ambulating; requires standing rest break; pt reaching for external UE bracing at times      Exercises General Exercises - Lower Extremity Long Arc Quad: AROM;Both;10 reps;Seated Hip Flexion/Marching: AROM;Both;10 reps;Seated   PT Diagnosis: Abnormality of gait  PT Problem List: Decreased mobility;Decreased balance;Decreased activity tolerance;Decreased safety awareness;Cardiopulmonary status limiting activity PT Treatment Interventions: DME instruction;Gait training;Functional mobility training;Therapeutic exercise;Therapeutic activities;Balance training;Neuromuscular re-education;Patient/family education     PT Goals(Current goals can be found in the care plan section) Acute Rehab PT Goals Patient Stated Goal: to do better PT Goal Formulation: With patient Time For Goal Achievement: 03/16/13 Potential to Achieve Goals: Good  Visit Information  Last PT Received On: 03/02/13 Assistance Needed: +1 History of Present Illness: Ms. Kristy Morrow is a 63 year old woman with a past history of light tobacco abuse and ovarian cancer. In 2011 she had a CT of the chest  as part of her followup for ovarian cancer. She was  noted to have a groundglass opacity in the right upper lobe. She was referred to pulmonary and has been followed for this ever since. In October she had a CT which showed the lesion to be larger and also more solid in appearance. A PET CT was done earlier this month. The lesion was not hypermetabolic. pt is s/p video assisted thoracoscopy/wedge resection.        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 Communication Communication: No difficulties    Cognition  Cognition Arousal/Alertness: Awake/alert Behavior During Therapy: Anxious Overall Cognitive Status: Within Functional Limits for tasks assessed    Extremity/Trunk Assessment Upper Extremity Assessment Upper Extremity Assessment: Defer to OT evaluation Lower Extremity Assessment Lower Extremity Assessment: Overall WFL for tasks assessed Cervical / Trunk Assessment Cervical / Trunk Assessment: Normal   Balance Balance Overall balance assessment: Needs assistance;History of Falls Sitting-balance support: Feet supported;No upper extremity supported Sitting balance-Leahy Scale: Normal Standing balance support: No upper extremity supported;During functional activity Standing balance-Leahy Scale: Fair High level balance activites: Head turns;Turns;Direction changes High Level Balance Comments: pt reaching for External UE bracing at times; decreases speed significantly  when challenged with high level balance   End of Session PT - End of Session Equipment Utilized During Treatment: Gait belt Activity Tolerance: Patient tolerated treatment well Patient left: in chair;with call bell/phone within reach Nurse Communication: Mobility status;Precautions  GP     Gustavus Bryant, [place] 03/02/2013, 9:11 AM

## 2013-03-02 NOTE — Progress Notes (Signed)
Utilization review completed.  

## 2013-03-02 NOTE — Progress Notes (Addendum)
       [address]       [place] [address]             [phone]          6 Days Post-Op Procedure(s) (LRB): VIDEO ASSISTED THORACOSCOPY (VATS)/WEDGE RESECTION RIGHT LUNG UPPER LOBECTOMY,NODE SAMPLING (Right)  Subjective: Feels a little better today.  No nausea this am.  Appetite slowly improving.   Objective: Vital signs in last 24 hours: Patient Vitals for the past 24 hrs:  BP Temp Temp src Pulse Resp SpO2  03/02/13 0731 114/57 mmHg 98.1 F (36.7 C) Oral 95 18 99 %  03/02/13 0320 123/59 mmHg 98 F (36.7 C) Oral 103 18 98 %  03/01/13 2357 131/58 mmHg 98.1 F (36.7 C) Oral 103 16 99 %  03/01/13 2002 139/72 mmHg - - 100 16 97 %  03/01/13 2000 - 97.9 F (36.6 C) Oral - - -  03/01/13 1745 117/66 mmHg - - 113 22 98 %  03/01/13 1727 93/65 mmHg - - 107 19 97 %  03/01/13 1609 - - - 85 16 97 %  03/01/13 1524 118/50 mmHg 98.8 F (37.1 C) Oral 102 20 97 %  03/01/13 1228 122/57 mmHg - - 90 16 99 %  03/01/13 1200 122/57 mmHg 98.3 F (36.8 C) Oral 98 18 98 %  03/01/13 0940 110/52 mmHg - - 92 14 100 %  03/01/13 0938 126/59 mmHg - - 87 15 99 %  03/01/13 0800 141/63 mmHg 97.6 F (36.4 C) Axillary 82 16 98 %   Current Weight  02/24/13 149 lb 14.6 oz (68 kg)     Intake/Output from previous day: 01/14 0701 - 01/15 0700 In: 600 [P.O.:600] Out: 4450 [Urine:4450]    PHYSICAL EXAM:  Heart: RRR Lungs:Clear Wound: Clean and dry    Lab Results: CBC:No results found for this basename: WBC, HGB, HCT, PLT,  in the last 72 hours BMET:  Recent Labs  03/01/13 1515 03/02/13 0245  NA 124* 128*  K 3.6* 3.9  CL 86* 90*  CO2 25 22  GLUCOSE 97 108*  BUN 9 10  CREATININE 0.57 0.62  CALCIUM 8.6 8.6    PT/INR: No results found for this basename: LABPROT, INR,  in the last 72 hours  pBNP 530 procalcitonin  <0.10 Mg 2.1    Assessment/Plan: S/P Procedure(s) (LRB): VIDEO ASSISTED THORACOSCOPY (VATS)/WEDGE RESECTION RIGHT LUNG UPPER LOBECTOMY,NODE  SAMPLING (Right) Hyponatremia- Na normalizing.  IVF stopped. Appreciate CCM assistance.  Will continue to monitor.  Stable otherwise postop. Continue to mobilize, work on pulm toilet/IS. Possibly home 1-2 days if Na continues to trend upward.   LOS: 6 days    COLLINS,GINA H 03/02/2013  Patient seen and examined, agree with above PATH- stage 1A- T1N0- d/w patient Sodium better, looks and feels better Possibly home tomorrow

## 2013-03-02 NOTE — Discharge Summary (Signed)
[address]       [place] [address]             [phone]              Discharge Summary  Name: Kristy Morrow DOB: 09-Jun-1950 63 y.o. MRN: [id number]   Admission Date: 02/24/2013 Discharge Date: 03/02/2013    Admitting Diagnosis: Right upper lobe lung mass   Discharge Diagnosis:  Invasive adenocarcinoma right lung (T1a, N0, Mx) Acute postoperative hyponatremia Expected postop blood loss anemia  Past Medical History  Diagnosis Date  . Unspecified essential hypertension   . Unspecified vitamin D deficiency   . Allergic rhinitis   . Pulmonary nodule, right     RUL 8-35mm GGO noted Sept 2011. Stable march 2012  . COPD (chronic obstructive pulmonary disease) sept 2011    seen on CT. Gold stage 0 on PFT Oct 2011  . Ovarian cancer aug 2010    s/p TAH BSO. Dr Robert Bellow. Stage 1A low grade cystadenocarinoma      Procedures: RIGHT VIDEO ASSISTED THORACOSCOPY, RIGHT UPPER LOBECTOMY, LYMPH NODE DISSECTION - 02/25/2013     HPI:  The patient is a 63 y.o. female with a past history of light tobacco abuse and ovarian cancer. In 2011, she had a CT of the chest as part of her followup for ovarian cancer. She was noted to have a ground glass opacity in the right upper lobe. She was referred to pulmonary and has been followed for this ever since. In October 2014, she had a CT which showed the lesion to be larger and also more solid in appearance. A PET CT was done earlier this month. The lesion was not hypermetabolic.  She states that she smoked one half a pack of cigarettes daily for 30 years until she quit in 2006. She denies any chest pain, pressure, or tightness. She denies shortness of breath. She denies cough, hemoptysis, and wheezing. Her weight has been stable. Her appetite is good. Overall she feels well. She was referred to Dr. Roxan Hockey for thoracic surgical evaluation.  It was felt that she should undergo a right VATS wedge resection, possible right  upper lobectomy. All risks, benefits and alternatives of surgery were explained in detail, and the patient agreed to proceed.    Hospital Course:  The patient was admitted to [facility] on 02/24/2013. The patient was taken to the operating room and underwent the above procedure.    The postoperative course has been notable for hyponatremia, with sodium as low as 112.  She has had some associated nausea, dizziness and fatigue, but no syncope, headaches or seizures. She has been treated with gentle IV hydration with normal saline, fluid restriction, as well as diuresis with IV Lasix. Her sodium has slowly corrected, and at present is 130.    From a post-surgical standpoint, she has progressed well. Her chest tubes have been removed in the standard fashion, and follow up chest x-rays have been stable with no pneumothorax.  She is tolerating a regular diet and is ambulating in the halls. Incisions are healing well. She presently is medically stable for discharge home.     Recent vital signs:  Filed Vitals:   03/02/13 0731  BP: 114/57  Pulse: 95  Temp: 98.1 F (36.7 C)  Resp: 18    Recent laboratory studies:  CBC: Lab Results  Component Value Date   WBC 7.3 02/26/2013   HGB 10.2* 02/26/2013   HCT 30.1*  02/26/2013   MCV 88.3 02/26/2013   PLT 137* 02/26/2013    BMET:  Lab Results  Component Value Date   CREATININE 0.66 03/03/2013   BUN 9 03/03/2013   NA 130* 03/03/2013   K 4.4 03/03/2013   CL 94* 03/03/2013   CO2 22 03/03/2013     PT/INR: No results found for this basename: LABPROT, INR,  in the last 72 hours   Discharge Medications:     Medication List    STOP taking these medications       mupirocin ointment 2 %  Commonly known as:  BACTROBAN      TAKE these medications       levofloxacin 500 MG tablet  Commonly known as:  LEVAQUIN  Take 1 tablet (500 mg total) by mouth daily. X 4 more days     losartan 50 MG tablet  Commonly known as:  COZAAR  Take 50 mg by mouth  daily.     metoprolol succinate 50 MG 24 hr tablet  Commonly known as:  TOPROL-XL  Take 1 tablet by mouth Daily.     multivitamin capsule  Take 1 capsule by mouth daily.     oxyCODONE-acetaminophen 5-325 MG per tablet  Commonly known as:  PERCOCET/ROXICET  Take 1-2 tablets by mouth every 4 (four) hours as needed for severe pain.     Vitamin D 1000 UNITS capsule  Take 1,000 Units by mouth daily.          Discharge Instructions:  The patient is to refrain from driving, heavy lifting or strenuous activity.  May shower daily and clean incisions with soap and water.  May resume regular diet.   Follow Up: Follow-up Information   Follow up with Melrose Nakayama, MD On 03/21/2013. (Have a chest x-ray at [facility] at 9:45, then see MD at 10:45)    Specialty:  Cardiothoracic Surgery   Contact information:   [place] [address] [phone]       Follow up with [facility] NURSE On 03/09/2013. (Dr. Leonarda Salon nurse will remove sutures at 10:15.)       Follow up On 03/09/2013. (Have labs (BMET) drawn at [facility] (same building as [facility] office) prior to nurse visit)            Burke Keels 03/02/2013, 9:57 AM

## 2013-03-02 NOTE — Consult Note (Signed)
PULMONARY  / CRITICAL CARE MEDICINE  Name: Kristy Morrow MRN: [id number] DOB: Jun 20, 1950 PCP Antony Blackbird, MD    ADMISSION DATE:  02/24/2013  LOS 6 days   CONSULTATION DATE:  03/01/13  REFERRING MD :  Dr Vernia Buff PRIMARY SERVICE: [facility]  CHIEF COMPLAINT:  Acute hyponatremia  BRIEF PATIENT DESCRIPTION:    Office patient of Dr Chase Caller. Has hx of Ovarian cancer and s/p TAH BSO in 2010. Then RUL nodule sotted sept 20111 and has been stable till end 2014. Then nodule changed in character. Underwent RULobectomy 02/25/13 and diagnosed with NSCLC Stage 1A. Post operative was doing well but on 02/26/13 developed sore throat, gagging and some vomitting and since then cough with yellow sputum. RN thinks also immediate post op got hypotonic saline. No hx of diuretic use. However, on 02/26/13 POD#1 Na dropped to 123 and following day to 115 and then on 02/28/13 to 112 (drop despite saline lock x 24h). STarted on normal saline at 100cc/h with lasix 40mg  IV Q24h 02/28/13 and on 03/01/13 Na increased to 116 (up by 40meq in 24h)  She has had no seizures, coma, confusion but has had nausea, and some unsteady gait as a result of low Na  PCCM consulted for low Na mgmt   SIGNIFICANT EVENTS / STUDIES:  02/24/2013 - Right uper lobectomy NSCLC    LINES / TUBES: none  CULTURES: Results for orders placed during the hospital encounter of 02/22/13  SURGICAL PCR SCREEN     Status: Abnormal   Collection Time    02/22/13  2:22 PM      Result Value Range Status   MRSA, PCR NEGATIVE  NEGATIVE Final   Staphylococcus aureus POSITIVE (*) NEGATIVE Final   Comment:            The Xpert SA Assay (FDA     approved for NASAL specimens     in patients over 60 years of age),     is one component of     a comprehensive surveillance     program.  Test performance has     been validated by [employer] for patients greater     than or equal to 6 year old.     It is not intended     to diagnose infection  nor to     guide or monitor treatment.     ANTIBIOTICS: Anti-infectives   Start     Dose/Rate Route Frequency Ordered Stop   03/01/13 1200  levofloxacin (LEVAQUIN) tablet 500 mg     500 mg Oral Daily 03/01/13 0944     02/24/13 2000  cefUROXime (ZINACEF) 1.5 g in dextrose 5 % 50 mL IVPB     1.5 g 100 mL/hr over 30 Minutes Intravenous Every 12 hours 02/24/13 1648 02/25/13 0830   02/24/13 0600  cefUROXime (ZINACEF) 1.5 g in dextrose 5 % 50 mL IVPB     1.5 g 100 mL/hr over 30 Minutes Intravenous 60 min pre-op 02/23/13 1442 02/24/13 0813      SUBJECTIVE:   03/02/13: Significantly better after Na now 128; 54meq rise in 24h. Fluids now stopped  VITAL SIGNS: Filed Vitals:   03/01/13 2002 03/01/13 2357 03/02/13 0320 03/02/13 0731  BP: 139/72 131/58 123/59 114/57  Pulse: 100 103 103 95  Temp:  98.1 F (36.7 C) 98 F (36.7 C) 98.1 F (36.7 C)  TempSrc:  Oral Oral Oral  Resp: 16 16 18 18   Height:  Weight:      SpO2: 97% 99% 98% 99%    INTAKE / OUTPUT: I/O last 3 completed shifts: In: 1700 [P.O.:600; I.V.:1100] Out: 4450 [Urine:4450]     PHYSICAL EXAMINATION: General:  Looks well. Sitting and talking Neuro:  AXOx3. Speech normal;. Movesl all4s. RN reports walking around hallway with walker without probelsm HEENT:  Dry mouth. No thrush Cardiovascular:  HR81. Looks euvolemic. S1S@+. No murmurs Lungs:  CTA bilaterally. No distress Abdomen:  Soft. Non tender. No organomegaly Musculoskeletal:  No cyanosis. No clubbing. No edema Skin:  intact  LABS: PULMONARY  Recent Labs Lab 02/25/13 0648  PHART 7.389  PCO2ART 38.9  PO2ART 149.0*  HCO3 23.0  TCO2 24.2  O2SAT 99.4    CBC  Recent Labs Lab 02/25/13 0650 02/26/13 0514  HGB 9.2* 10.2*  HCT 28.5* 30.1*  WBC 7.1 7.3  PLT 126* 137*    COAGULATION No results found for this basename: INR,  in the last 168 hours  CARDIAC  No results found for this basename: TROPONINI,  in the last 168 hours  Recent  Labs Lab 03/02/13 0245  PROBNP 530.0*     CHEMISTRY  Recent Labs Lab 02/28/13 0434 02/28/13 1546 03/01/13 0250 03/01/13 1515 03/02/13 0245  NA 112* 114* 116* 124* 128*  K 4.4 4.2 3.4* 3.6* 3.9  CL 77* 79* 79* 86* 90*  CO2 24 21 22 25 22   GLUCOSE 100* 108* 90 97 108*  BUN 9 9 11 9 10   CREATININE 0.44* 0.51 0.53 0.57 0.62  CALCIUM 8.2* 7.9* 7.9* 8.6 8.6  MG  --   --   --   --  2.1  PHOS  --   --   --   --  1.4*   Estimated Creatinine Clearance: 64.8 ml/min (by C-G formula based on Cr of 0.62).   LIVER  Recent Labs Lab 02/26/13 0514  AST 40*  ALT 35  ALKPHOS 60  BILITOT 0.5  PROT 6.4  ALBUMIN 3.0*     INFECTIOUS  Recent Labs Lab 03/01/13 1010 03/02/13 0245  PROCALCITON <0.10 <0.10     ENDOCRINE CBG (last 3)  No results found for this basename: GLUCAP,  in the last 72 hours       IMAGING x48h  Dg Chest 2 View  03/01/2013   CLINICAL DATA:  History of lung cancer and postop VATS.  EXAM: CHEST  2 VIEW  COMPARISON:  02/28/2013  FINDINGS: Two views of the chest were obtained. Elevation of the right hemidiaphragm related to right chest surgery. Few densities in the right hilum and right upper lung may represent postsurgical changes or atelectasis. There appears to be a tiny right pneumothorax. Small amount of subcutaneous gas in the right lower chest. Left lung has a few basilar densities that most likely represent atelectasis. Heart size is within normal limits. No definite pleural effusions.  IMPRESSION: There may be a tiny right pneumothorax.  Few patchy densities in the right upper lung are likely related to atelectasis and postsurgical changes.   Electronically Signed   By: Markus Daft M.D.   On: 03/01/2013 07:35   Dg Chest Port 1 View  02/28/2013   CLINICAL DATA:  Chest tube removal  EXAM: PORTABLE CHEST - 1 VIEW  COMPARISON:  Study obtained earlier in the day  FINDINGS: Right chest tube as well as central catheter have been removed. There is no  appreciable pneumothorax.  There is patchy infiltrate in the right upper lobe. Lungs are otherwise clear.  Heart size and pulmonary vascularity are normal. No adenopathy.  IMPRESSION: No demonstrable pneumothorax. Patchy infiltrate right upper lobe present.   Electronically Signed   By: Lowella Grip M.D.   On: 02/28/2013 10:59       ASSESSMENT / PLAN:  PULMONARY A s/p RUL lobectomy 02/25/13 for Stage 1A NSCLC. Doing well P:   Per [facility]  CARDIOVASCULAR A: Nil acute P:  Monitor  RENAL A:  Acute Post op hyponatremia. Finally improving after stating N Saline 02/28/13. Orthostat Suspect post op hypotonic saline, dehydration  Also possible post op pneumonia as cause   03/02/12: 12 meq rise in Nas in 24h; saline stopped 12h ago. Low phos +  P:   Replete low phos Monitor Na; slow correction only  GASTROINTESTINAL A:  Nausea related to low Na P:   REglan prn  HEMATOLOGIC A:  Post op anemia P:  PRBC for hgb < 7gm%   INFECTIOUS A:  Possible pneumonia. MSSSA PCR positive P:   Start po levaquin Check PCT  ENDOCRINE A:  Nil acute P:   monitor  NEUROLOGIC A:  unsteadly gait due to low Na P:   PT consult Monitor progress with Na correction  TODAY'S SUMMARY:  patient updated. Improving. Replete phos. Home when Na consistently > 130   Dr. Brand Males, M.D., [facility].C.P Pulmonary and Critical Care Medicine Staff Physician [facility] Pager: [phone], If no answer or between  15:00h - 7:00h: call [phone]  03/02/2013 10:37 AM

## 2013-03-03 ENCOUNTER — Inpatient Hospital Stay (HOSPITAL_COMMUNITY): Payer: BC Managed Care – PPO

## 2013-03-03 LAB — PHOSPHORUS: PHOSPHORUS: 3.5 mg/dL (ref 2.3–4.6)

## 2013-03-03 LAB — BASIC METABOLIC PANEL
BUN: 9 mg/dL (ref 6–23)
CALCIUM: 9 mg/dL (ref 8.4–10.5)
CO2: 22 mEq/L (ref 19–32)
Chloride: 94 mEq/L — ABNORMAL LOW (ref 96–112)
Creatinine, Ser: 0.66 mg/dL (ref 0.50–1.10)
Glucose, Bld: 99 mg/dL (ref 70–99)
Potassium: 4.4 mEq/L (ref 3.7–5.3)
SODIUM: 130 meq/L — AB (ref 137–147)

## 2013-03-03 LAB — PROCALCITONIN

## 2013-03-03 LAB — MAGNESIUM: Magnesium: 2 mg/dL (ref 1.5–2.5)

## 2013-03-03 MED ORDER — LEVOFLOXACIN 500 MG PO TABS: 500.0000 mg | ORAL_TABLET | Freq: Every day | ORAL | Status: DC

## 2013-03-03 MED ORDER — OXYCODONE-ACETAMINOPHEN 5-325 MG PO TABS: 1.0000 | ORAL_TABLET | ORAL | Status: DC | PRN

## 2013-03-03 NOTE — Progress Notes (Signed)
7 Days Post-Op Procedure(s) (LRB): VIDEO ASSISTED THORACOSCOPY (VATS)/WEDGE RESECTION RIGHT LUNG UPPER LOBECTOMY,NODE SAMPLING (Right) Subjective: Feels well A little more pain today Ambulated 3 times yesterday  Objective: Vital signs in last 24 hours: Temp:  [97.2 F (36.2 C)-98.3 F (36.8 C)] 98.1 F (36.7 C) (01/16 0410) Pulse Rate:  [76-95] 82 (01/16 0410) Cardiac Rhythm:  [-] Normal sinus rhythm (01/16 0410) Resp:  [12-18] 15 (01/16 0410) BP: (108-134)/(42-70) 111/54 mmHg (01/16 0410) SpO2:  [92 %-100 %] 100 % (01/16 0410)  Hemodynamic parameters for last 24 hours:    Intake/Output from previous day: 01/15 0701 - 01/16 0700 In: 600 [P.O.:600] Out: 600 [Urine:600] Intake/Output this shift:    General appearance: alert and no distress Neurologic: intact Heart: regular rate and rhythm Lungs: diminished breath sounds right base Wound: clean and dry  Lab Results: No results found for this basename: WBC, HGB, HCT, PLT,  in the last 72 hours BMET:  Recent Labs  03/02/13 0245 03/03/13 0244  NA 128* 130*  K 3.9 4.4  CL 90* 94*  CO2 22 22  GLUCOSE 108* 99  BUN 10 9  CREATININE 0.62 0.66  CALCIUM 8.6 9.0    PT/INR: No results found for this basename: LABPROT, INR,  in the last 72 hours ABG    Component Value Date/Time   PHART 7.389 02/25/2013 0648   HCO3 23.0 02/25/2013 0648   TCO2 24.2 02/25/2013 0648   ACIDBASEDEF 1.3 02/25/2013 0648   O2SAT 99.4 02/25/2013 0648   CBG (last 3)  No results found for this basename: GLUCAP,  in the last 72 hours  Assessment/Plan: S/P Procedure(s) (LRB): VIDEO ASSISTED THORACOSCOPY (VATS)/WEDGE RESECTION RIGHT LUNG UPPER LOBECTOMY,NODE SAMPLING (Right) Plan for discharge: see discharge orders  Doing well  Hyponatremia resolving- Na 130 today Day 3/7 levaquin CXR looks good Home today   LOS: 7 days    Kristy Morrow C 03/03/2013

## 2013-03-03 NOTE — Progress Notes (Signed)
Patient has been discharged home this AM. IV has been D/C. Have reviewed all discharge instructions with the patient and answered all questions.

## 2013-03-03 NOTE — Progress Notes (Signed)
Physical Therapy Treatment Patient Details Name: Kristy Morrow MRN: [id number] DOB: 1950/11/21 Today's Date: 03/03/2013 Time: 9518-8416 PT Time Calculation (min): 9 min  PT Assessment / Plan / Recommendation  History of Present Illness Kristy Morrow is a 63 year old woman with a past history of light tobacco abuse and ovarian cancer. In 2011 she had a CT of the chest as part of her followup for ovarian cancer. She was noted to have a groundglass opacity in the right upper lobe. She was referred to pulmonary and has been followed for this ever since. In October she had a CT which showed the lesion to be larger and also more solid in appearance. A PET CT was done earlier this month. The lesion was not hypermetabolic. pt is s/p video assisted thoracoscopy/wedge resection.    PT Comments   Pt in very high spirits today. Plans to D/C home. Pt reports she will have 24/7 (A) upon D/C from her brother and her daughter. Pt was able to ambulate around the unit at supervision level for safety and able to perform high level balance activities. Educated on HEP for general LE strengthening. Will sign off on pt at this time.   Follow Up Recommendations  No PT follow up;Supervision - Intermittent     Does the patient have the potential to tolerate intense rehabilitation     Barriers to Discharge        Equipment Recommendations  None recommended by PT    Recommendations for Other Services    Frequency Min 3X/week   Progress towards PT Goals Progress towards PT goals: Goals met/education completed, patient discharged from PT  Plan Current plan remains appropriate    Precautions / Restrictions Precautions Precautions: None Precaution Comments: reports she "trips and falls a lot" Restrictions Weight Bearing Restrictions: No   Pertinent Vitals/Pain VSS.     Mobility  Bed Mobility General bed mobility comments: not assessed; pt sitting EOB  Transfers Overall transfer level: Modified  independent Equipment used: None Transfers: Sit to/from Stand Sit to Stand: Modified independent (Device/Increase time) General transfer comment: incr time due to LE weakness; no LOB or instability noted Ambulation/Gait Ambulation/Gait assistance: Supervision Ambulation Distance (Feet): 300 Feet Assistive device: None Gait Pattern/deviations: WFL(Within Functional Limits) Gait velocity: increased gt speed today  Gait velocity interpretation: at or above normal speed for age/gender General Gait Details: pt challenged with high level balance activities; supervision level for safety; pt continues to report he legs feel "fatigued"; no LOB noted     Exercises Other Exercises Other Exercises: educated on HEP to improve functional strength in bil LEs; demo mini squats; standing hip flexion; standing hip abduction; pt verbalized understanding and appreciative   PT Diagnosis:    PT Problem List:   PT Treatment Interventions:     PT Goals (current goals can now be found in the care plan section) Acute Rehab PT Goals Patient Stated Goal: home today PT Goal Formulation: With patient Time For Goal Achievement: 03/16/13 Potential to Achieve Goals: Good  Visit Information  Last PT Received On: 03/03/13 Assistance Needed: +1 History of Present Illness: Kristy Morrow is a 63 year old woman with a past history of light tobacco abuse and ovarian cancer. In 2011 she had a CT of the chest as part of her followup for ovarian cancer. She was noted to have a groundglass opacity in the right upper lobe. She was referred to pulmonary and has been followed for this ever since. In October she had a CT which showed the  lesion to be larger and also more solid in appearance. A PET CT was done earlier this month. The lesion was not hypermetabolic. pt is s/p video assisted thoracoscopy/wedge resection.     Subjective Data  Subjective: Pt sitting EOB; " im just so happy i get to go home today. you guys have been like  angels"  Patient Stated Goal: home today   Cognition  Cognition Arousal/Alertness: Awake/alert Behavior During Therapy: WFL for tasks assessed/performed Overall Cognitive Status: Within Functional Limits for tasks assessed    Balance  Balance Overall balance assessment: History of Falls;Modified Independent High level balance activites: Turns;Direction changes;Sudden stops;Head turns High Level Balance Comments: Pt challenged with high level balance activities; was able to perform without physical (A); requires incr time to ensure balance  End of Session PT - End of Session Activity Tolerance: Patient tolerated treatment well Patient left: in chair;with call bell/phone within reach;with nursing/sitter in room Nurse Communication: Mobility status   GP     Gustavus Bryant, [place] 03/03/2013, 8:18 AM

## 2013-03-09 ENCOUNTER — Ambulatory Visit (INDEPENDENT_AMBULATORY_CARE_PROVIDER_SITE_OTHER): Payer: Self-pay

## 2013-03-09 DIAGNOSIS — Z4802 Encounter for removal of sutures: Secondary | ICD-10-CM

## 2013-03-09 DIAGNOSIS — C3410 Malignant neoplasm of upper lobe, unspecified bronchus or lung: Secondary | ICD-10-CM

## 2013-03-09 DIAGNOSIS — D381 Neoplasm of uncertain behavior of trachea, bronchus and lung: Secondary | ICD-10-CM

## 2013-03-09 DIAGNOSIS — Z902 Acquired absence of lung [part of]: Secondary | ICD-10-CM

## 2013-03-10 LAB — BASIC METABOLIC PANEL
BUN: 13 mg/dL (ref 6–23)
CO2: 27 mEq/L (ref 19–32)
Calcium: 9.2 mg/dL (ref 8.4–10.5)
Chloride: 101 mEq/L (ref 96–112)
Creat: 0.73 mg/dL (ref 0.50–1.10)
Glucose, Bld: 99 mg/dL (ref 70–99)
Potassium: 5.1 mEq/L (ref 3.5–5.3)
Sodium: 134 mEq/L — ABNORMAL LOW (ref 135–145)

## 2013-03-15 NOTE — Progress Notes (Unsigned)
Kristy Morrow returns for suture removal x 1 from a previous chest tube site, which was easily done.  There are two sq. Port sites.  All sites are very well healed.  She has completed her antibiotics and is no longer taking the Percocet. She will return as scheduled with a cxr.

## 2013-03-20 ENCOUNTER — Other Ambulatory Visit: Payer: Self-pay | Admitting: *Deleted

## 2013-03-20 DIAGNOSIS — R222 Localized swelling, mass and lump, trunk: Secondary | ICD-10-CM

## 2013-03-21 ENCOUNTER — Encounter: Payer: Self-pay | Admitting: Thoracic Surgery (Cardiothoracic Vascular Surgery)

## 2013-03-21 ENCOUNTER — Ambulatory Visit
Admission: RE | Admit: 2013-03-21 | Discharge: 2013-03-21 | Disposition: A | Discharge status: Home / Self Care | Payer: BC Managed Care – PPO | Source: Ambulatory Visit | Attending: Thoracic Surgery (Cardiothoracic Vascular Surgery) | Admitting: Thoracic Surgery (Cardiothoracic Vascular Surgery)

## 2013-03-21 ENCOUNTER — Ambulatory Visit (INDEPENDENT_AMBULATORY_CARE_PROVIDER_SITE_OTHER): Payer: Self-pay | Admitting: Thoracic Surgery (Cardiothoracic Vascular Surgery)

## 2013-03-21 VITALS — BP 134/87 | HR 88 | Resp 20 | Ht 62.0 in | Wt 142.0 lb

## 2013-03-21 DIAGNOSIS — R222 Localized swelling, mass and lump, trunk: Secondary | ICD-10-CM

## 2013-03-21 DIAGNOSIS — Z902 Acquired absence of lung [part of]: Secondary | ICD-10-CM

## 2013-03-21 DIAGNOSIS — Z9889 Other specified postprocedural states: Secondary | ICD-10-CM

## 2013-03-21 DIAGNOSIS — Z09 Encounter for follow-up examination after completed treatment for conditions other than malignant neoplasm: Secondary | ICD-10-CM

## 2013-03-21 DIAGNOSIS — C3410 Malignant neoplasm of upper lobe, unspecified bronchus or lung: Secondary | ICD-10-CM

## 2013-03-21 NOTE — Progress Notes (Signed)
  HPI:  Kristy Morrow returns today for scheduled postoperative followup visit. She had a thoracoscopic right upper lobectomy for a T1a, N0, stage IA non-small cell carcinoma on January 9. Her postoperative course was complicated by hyponatremia with sodium as well as 112. That eventually resolved and she was discharged home. A followup sodium a week after discharge was 134.  She says that she feels well. She does have some incisional discomfort in the right anterior chest wall. She has not taken any narcotics since discharge and has just been using extra strength Tylenol. She drove daily did not have any problems. She is anxious to increase her activities.  Past Medical History  Diagnosis Date  . Unspecified essential hypertension   . Unspecified vitamin D deficiency   . Allergic rhinitis   . Pulmonary nodule, right     RUL 8-64mm GGO noted Sept 2011. Stable march 2012  . COPD (chronic obstructive pulmonary disease) sept 2011    seen on CT. Gold stage 0 on PFT Oct 2011  . Ovarian cancer aug 2010    s/p TAH BSO. Dr Robert Bellow. Stage 1A low grade cystadenocarinoma      Current Outpatient Prescriptions  Medication Sig Dispense Refill  . Cholecalciferol (VITAMIN D) 1000 UNITS capsule Take 1,000 Units by mouth daily.        Marland Kitchen losartan (COZAAR) 50 MG tablet Take 50 mg by mouth daily.        . metoprolol (TOPROL-XL) 50 MG 24 hr tablet Take 1 tablet by mouth Daily.      . Multiple Vitamin (MULTIVITAMIN) capsule Take 1 capsule by mouth daily.         No current facility-administered medications for this visit.    Physical Exam BP 134/87  Pulse 88  Resp 20  Ht 5\' 2"  (1.575 m)  Wt 142 lb (64.411 kg)  BMI 25.97 kg/m2  SpO95 64% 63 year old woman in no acute distress Well-developed and well-nourished Lungs clear with essentially equal breath sounds bilaterally Incisions well healed Peripheral edema  Diagnostic Tests: Chest x-ray 03/21/2013 CHEST 2 VIEW  COMPARISON: 03/03/2013  FINDINGS:   Postop changes right upper lobectomy. Right medial upper lobe  density is slightly more prominent on the current study which may be  due to postop change or atelectasis.  Negative for heart failure or effusion. Negative for pneumonia.  IMPRESSION:  Right upper lobectomy changes. Right apical density slightly more  prominent on the current study. Continued followup is warranted.  Electronically Signed  By: Franchot Gallo M.D.  On: 03/21/2013 10:35  Impression: 63 year old woman who is now about 3 weeks post thoracoscopic right upper lobectomy for a stage IA non-small cell carcinoma. She is doing exceptionally well. Her exercise tolerance is good and continues to improve. She is anxious to increase her activities. She has not had to take any narcotics since surgery.  She may begin driving, appropriate precautions were discussed. Her activities are otherwise unrestricted, but she was advised to build into new activities gradually to avoid undue discomfort.  Plan: I will see her back in 2 months we'll do a PA and lateral chest x-ray at that time.

## 2013-03-21 NOTE — Patient Instructions (Signed)
You may begin driving, be cautious starting out  No other restrictions  Return in 2 months

## 2013-05-03 ENCOUNTER — Ambulatory Visit (INDEPENDENT_AMBULATORY_CARE_PROVIDER_SITE_OTHER): Payer: BC Managed Care – PPO | Admitting: Internal Medicine

## 2013-05-03 ENCOUNTER — Encounter: Payer: Self-pay | Admitting: Internal Medicine

## 2013-05-03 VITALS — BP 118/70 | HR 51 | Ht 62.0 in | Wt 150.0 lb

## 2013-05-03 DIAGNOSIS — J439 Emphysema, unspecified: Secondary | ICD-10-CM

## 2013-05-03 DIAGNOSIS — J438 Other emphysema: Secondary | ICD-10-CM

## 2013-05-03 DIAGNOSIS — M546 Pain in thoracic spine: Secondary | ICD-10-CM

## 2013-05-03 MED ORDER — TIOTROPIUM BROMIDE MONOHYDRATE 18 MCG IN CAPS: 18.0000 ug | ORAL_CAPSULE | Freq: Every day | RESPIRATORY_TRACT | Status: DC

## 2013-05-03 NOTE — Progress Notes (Signed)
Subjective:    Patient ID: Kristy Morrow, female    DOB: [birth year], 63 y.o.   MRN: [id number]  HPI  Followup emphysema  (pre lobectomy classified as gold 0 but has emphysema on CT) with right upper lobe nodule non-small cell lung cancer stage I status post lobectomy in January 8527 complicateded by hyponatremia  OV 05/03/2013 Chief Complaint  Patient presents with  . Follow-up    Pt d/c from [facility] 2 months ago for lobectomy. Pt still c/o of soreness in chest from sx, c/o tightness in chest when lying down, PND, and feels the need to constantly clear throat. Pt states after activity she is unable to expel air.       Kristy Morrow continues to do well. Her hyponatremia has resolved. Her energy levels are slowly improving. She still has some fatigue. Since the lobectomy she's complaining of unusual shortness of breath. She feels that she is doing taking  a long time to get her air out. She also feels a choking sensation periodically. Is no clear-cut dyspnea on exertion relieved by rest. Is not associated with cough or mucus production or wheezing or chest tightness. She is worried she might need medications on a permanent basis and she is not too keen on that.   In addition she's complaining of NEW postoperative right thoracic side pain that has a neuropathic element into it. Moderate in intensity. Associated hyperesthesia +. Constant with ups and downs. No clear cut releiving factors. Made worse by touch. She does not want  Neurontin. She prefers natural treatment like acupuncture; and is asking me to recommend some names  N no other issues  Review of Systems  Constitutional: Negative for fever and unexpected weight change.  HENT: Positive for postnasal drip. Negative for congestion, dental problem, ear pain, nosebleeds, rhinorrhea, sinus pressure, sneezing, sore throat and trouble swallowing.   Eyes: Positive for redness and itching.  Respiratory: Positive for cough and chest tightness.  Negative for shortness of breath and wheezing.   Cardiovascular: Negative for palpitations and leg swelling.  Gastrointestinal: Negative for nausea and vomiting.  Genitourinary: Negative for dysuria.  Musculoskeletal: Negative for joint swelling.  Skin: Negative for rash.  Neurological: Negative for headaches.  Hematological: Does not bruise/bleed easily.  Psychiatric/Behavioral: Negative for dysphoric mood. The patient is not nervous/anxious.        Objective:   Physical Exam  Vitals reviewed. Constitutional: She is oriented to person, place, and time. She appears well-developed and well-nourished. No distress.  HENT:  Head: Normocephalic and atraumatic.  Right Ear: External ear normal.  Left Ear: External ear normal.  Mouth/Throat: Oropharynx is clear and moist. No oropharyngeal exudate.  Eyes: Conjunctivae and EOM are normal. Pupils are equal, round, and reactive to light. Right eye exhibits no discharge. Left eye exhibits no discharge. No scleral icterus.  Neck: Normal range of motion. Neck supple. No JVD present. No tracheal deviation present. No thyromegaly present.  Cardiovascular: Normal rate, regular rhythm, normal heart sounds and intact distal pulses.  Exam reveals no gallop and no friction rub.   No murmur heard. Pulmonary/Chest: Effort normal and breath sounds normal. No respiratory distress. She has no wheezes. She has no rales. She exhibits no tenderness.  right chest wall scar + hyperestheisa +  Abdominal: Soft. Bowel sounds are normal. She exhibits no distension and no mass. There is no tenderness. There is no rebound and no guarding.  Musculoskeletal: Normal range of motion. She exhibits no edema and no tenderness.  Lymphadenopathy:    She has no cervical adenopathy.  Neurological: She is alert and oriented to person, place, and time. She has normal reflexes. No cranial nerve deficit. She exhibits normal muscle tone. Coordination normal.  Skin: Skin is warm and dry.  No rash noted. She is not diaphoretic. No erythema. No pallor.  Psychiatric: She has a normal mood and affect. Her behavior is normal. Judgment and thought content normal.          Assessment & Plan:

## 2013-05-03 NOTE — Patient Instructions (Addendum)
#  post operative pain  - please see DR Camillia Herter Acupuncturist(we discussed and you prefer this over neurontin, I suppport your approach)  - [id number], [place], [place] [address] [phone]  #Emphysema   - likely worse after lobectomy; post op lung function not known  - refer pulmonary rehab: indication: emphysema - Please start spiriva 1 puff daily - take  script and show technique  - call in  1 month to report progress  #Followup  3 months or sooner if needed Spirometry at followup

## 2013-05-04 DIAGNOSIS — M546 Pain in thoracic spine: Secondary | ICD-10-CM | POA: Insufficient documentation

## 2013-05-04 DIAGNOSIS — J439 Emphysema, unspecified: Secondary | ICD-10-CM | POA: Insufficient documentation

## 2013-05-04 NOTE — Assessment & Plan Note (Signed)
 #  Emphysema   - likely worse after lobectomy; post op lung function not known  - refer pulmonary rehab: indication: emphysema - Please start spiriva 1 puff daily - take  script and show technique  - call in  1 month to report progress  #Followup  3 months or sooner if needed Spirometry at followup

## 2013-05-04 NOTE — Assessment & Plan Note (Signed)
#  post operative pain  - please see DR Camillia Herter Acupuncturist(we discussed and you prefer this over neurontin, I suppport your approach)  - [id number], [place], [place] [address] [phone]

## 2013-05-17 ENCOUNTER — Other Ambulatory Visit: Payer: Self-pay | Admitting: *Deleted

## 2013-05-17 DIAGNOSIS — C3490 Malignant neoplasm of unspecified part of unspecified bronchus or lung: Secondary | ICD-10-CM

## 2013-05-19 ENCOUNTER — Telehealth (HOSPITAL_COMMUNITY): Payer: Self-pay

## 2013-05-19 NOTE — Telephone Encounter (Signed)
Patient declined Pulmonary Rehab because she states she is feeling "so much better" and she thinks her shortness of breath was being caused by allergies.  Patient states that she is exercising 4 days a week at home.  I emphasized the importance of staying active and eating healthy for pulmonary disease. She states she understands and that if she feels the need for it in the future she will ask for another referral.

## 2013-05-23 ENCOUNTER — Ambulatory Visit (INDEPENDENT_AMBULATORY_CARE_PROVIDER_SITE_OTHER): Payer: Self-pay | Admitting: Thoracic Surgery (Cardiothoracic Vascular Surgery)

## 2013-05-23 ENCOUNTER — Encounter: Payer: Self-pay | Admitting: Thoracic Surgery (Cardiothoracic Vascular Surgery)

## 2013-05-23 ENCOUNTER — Ambulatory Visit
Admission: RE | Admit: 2013-05-23 | Discharge: 2013-05-23 | Disposition: A | Discharge status: Home / Self Care | Payer: BC Managed Care – PPO | Source: Ambulatory Visit | Attending: Thoracic Surgery (Cardiothoracic Vascular Surgery) | Admitting: Thoracic Surgery (Cardiothoracic Vascular Surgery)

## 2013-05-23 VITALS — BP 122/77 | HR 70 | Resp 16 | Ht 62.0 in | Wt 146.0 lb

## 2013-05-23 DIAGNOSIS — Z902 Acquired absence of lung [part of]: Secondary | ICD-10-CM

## 2013-05-23 DIAGNOSIS — C3490 Malignant neoplasm of unspecified part of unspecified bronchus or lung: Secondary | ICD-10-CM

## 2013-05-23 DIAGNOSIS — Z9889 Other specified postprocedural states: Secondary | ICD-10-CM

## 2013-05-23 DIAGNOSIS — C3410 Malignant neoplasm of upper lobe, unspecified bronchus or lung: Secondary | ICD-10-CM

## 2013-05-23 NOTE — Progress Notes (Signed)
  HPI:  Kristy Morrow returns today for a scheduled appointment.  She is a 63 year old former smoker who underwent a thoracoscopic right upper lobectomy on 02/24/2013. She had a T1 N0 non-small cell cancer. She was last in the office on February 3. At that time she was still having difficulty with shortness of breath and incisional pain. She saw Dr. Chase Caller in March and was started on Spiriva. She says that she has not been using that lately as her breathing has gotten much better. She also may consider seeing an acupuncturist, but she never did that as her pain has improved dramatically.  Past Medical History  Diagnosis Date  . Unspecified essential hypertension   . Unspecified vitamin D deficiency   . Allergic rhinitis   . Pulmonary nodule, right     RUL 8-35mm GGO noted Sept 2011. Stable march 2012  . COPD (chronic obstructive pulmonary disease) sept 2011    seen on CT. Gold stage 0 on PFT Oct 2011  . Ovarian cancer aug 2010    s/p TAH BSO. Dr Robert Bellow. Stage 1A low grade cystadenocarinoma  . Non-small cell carcinoma of right lung, stage 1     s/p thoracoscopic RUL 02/24/2013     Current Outpatient Prescriptions  Medication Sig Dispense Refill  . losartan (COZAAR) 50 MG tablet Take 50 mg by mouth daily.        . metoprolol (TOPROL-XL) 50 MG 24 hr tablet Take 1 tablet by mouth Daily.      . Multiple Vitamin (MULTIVITAMIN) capsule Take 1 capsule by mouth daily.        . Vitamin D, Ergocalciferol, (DRISDOL) 50000 UNITS CAPS capsule Take 1 capsule by mouth. Once per week       No current facility-administered medications for this visit.    Physical Exam BP 122/77  Pulse 70  Resp 16  Ht 5\' 2"  (1.575 m)  Wt 146 lb (66.225 kg)  BMI 26.70 kg/m2  SpO67 55% 63 year old woman in no acute distress Alert and oriented x3 with no focal neurologic deficit Cardiac regular rate and rhythm normal S1 and S2 No cervical or subclavicular adenopathy Lungs mildly diminished right base otherwise  clear Incisions well healed  Diagnostic Tests: CHEST 2 VIEW  COMPARISON: 04/10/2013  FINDINGS:  Postop changes on the right with mild scarring in the right lung  base and right apex, unchanged. No recurrent mass lesion. Negative  for pneumonia or effusion.  IMPRESSION:  Stable postop changes on the right. No recurrent tumor.  Electronically Signed  By: Franchot Gallo M.D.  On: 05/23/2013 10:55    Impression: 63 year old woman who is now 3 months post right upper lobectomy for a T1 N0 non-small cell carcinoma. She is stage IA disease and has not had any indication for adjuvant therapy.  She has recovered well from her surgery. She's not having any complaints of pain or shortness of breath at this time. She did I did a prescription for Spiriva but has not been using that. She says she has been using Allegra for her allergies.  I will plan to see her back in 3 months for 6 month followup visit. We'll do a CT of the chest at that time.

## 2013-07-28 ENCOUNTER — Other Ambulatory Visit: Payer: Self-pay | Admitting: *Deleted

## 2013-07-28 DIAGNOSIS — C3490 Malignant neoplasm of unspecified part of unspecified bronchus or lung: Secondary | ICD-10-CM

## 2013-08-29 ENCOUNTER — Ambulatory Visit: Payer: BC Managed Care – PPO | Admitting: Thoracic Surgery (Cardiothoracic Vascular Surgery)

## 2013-08-29 ENCOUNTER — Ambulatory Visit
Admission: RE | Admit: 2013-08-29 | Discharge: 2013-08-29 | Disposition: A | Discharge status: Home / Self Care | Payer: BC Managed Care – PPO | Source: Ambulatory Visit | Attending: Thoracic Surgery (Cardiothoracic Vascular Surgery) | Admitting: Thoracic Surgery (Cardiothoracic Vascular Surgery)

## 2013-08-29 DIAGNOSIS — C3490 Malignant neoplasm of unspecified part of unspecified bronchus or lung: Secondary | ICD-10-CM

## 2013-09-18 ENCOUNTER — Other Ambulatory Visit: Payer: Self-pay | Admitting: Family

## 2013-09-18 ENCOUNTER — Ambulatory Visit
Admission: RE | Admit: 2013-09-18 | Discharge: 2013-09-18 | Disposition: A | Discharge status: Home / Self Care | Payer: BC Managed Care – PPO | Source: Ambulatory Visit | Attending: Family | Admitting: Family

## 2013-09-18 DIAGNOSIS — R600 Localized edema: Secondary | ICD-10-CM

## 2013-09-19 ENCOUNTER — Ambulatory Visit: Payer: BC Managed Care – PPO | Admitting: Thoracic Surgery (Cardiothoracic Vascular Surgery)

## 2013-09-26 ENCOUNTER — Ambulatory Visit (INDEPENDENT_AMBULATORY_CARE_PROVIDER_SITE_OTHER): Payer: BC Managed Care – PPO | Admitting: Thoracic Surgery (Cardiothoracic Vascular Surgery)

## 2013-09-26 ENCOUNTER — Encounter: Payer: Self-pay | Admitting: Thoracic Surgery (Cardiothoracic Vascular Surgery)

## 2013-09-26 VITALS — BP 113/72 | HR 63 | Ht 62.0 in | Wt 146.0 lb

## 2013-09-26 DIAGNOSIS — Z85118 Personal history of other malignant neoplasm of bronchus and lung: Secondary | ICD-10-CM

## 2013-09-26 NOTE — Progress Notes (Signed)
HPI:  Kristy Morrow is today for scheduled 6 month followup visit.  She is a 63 year old woman who had a thoracoscopic right upper lobectomy for a T1 N0 non-small cell carcinoma in January 2015. She did not require adjuvant therapy. Her last office visit was on 06/02/2013 at which time she was doing well.  She says that she feels well overall. She is concerned because she gets tired when she tries to exert herself. She was hoping that would improve, but now hopes that it will be better a year after her surgery. She doesn't really get short of breath with exertion, at least not any more so than prior to surgery. She says she just feels tired. She still has an occasional discomfort when she takes a deep breath. It will go away of she takes a couple of more deep breaths. She does complain of having more difficulty breathing this summer when the weather was very hot.  Her weight has been stable. She denies any hemoptysis.  Past Medical History  Diagnosis Date  . Unspecified essential hypertension   . Unspecified vitamin D deficiency   . Allergic rhinitis   . Pulmonary nodule, right     RUL 8-78mm GGO noted Sept 2011. Stable march 2012  . COPD (chronic obstructive pulmonary disease) sept 2011    seen on CT. Gold stage 0 on PFT Oct 2011  . Ovarian cancer aug 2010    s/p TAH BSO. Dr Robert Bellow. Stage 1A low grade cystadenocarinoma  . Non-small cell carcinoma of right lung, stage 1     s/p thoracoscopic RUL 02/24/2013      Current Outpatient Prescriptions  Medication Sig Dispense Refill  . loratadine (CLARITIN) 10 MG tablet Take by mouth.      . losartan (COZAAR) 50 MG tablet Take 50 mg by mouth daily.        . metoprolol (TOPROL-XL) 50 MG 24 hr tablet Take 1 tablet by mouth Daily.      . Multiple Vitamin (MULTIVITAMIN) capsule Take 1 capsule by mouth daily.        Marland Kitchen ibuprofen (ADVIL,MOTRIN) 200 MG tablet Take 200 mg by mouth.      . Probiotic Product (ACIDOPHILUS/GOAT MILK) CAPS Take by mouth.        No current facility-administered medications for this visit.    Physical Exam BP 113/72  Pulse 63  Ht 5\' 2"  (1.575 m)  Wt 146 lb (66.225 kg)  BMI 26.70 kg/m2  SpO7 35% 63 year old woman in no acute distress Well-developed well-nourished Alert and oriented x3 with no focal deficits Lungs clear with equal breath sounds bilaterally Incisions well healed No cervical or suprapubic or adenopathy Cardiac regular rate and rhythm normal S1 and S2  Diagnostic Tests: CT of chest 08/29/2013 CT CHEST WITHOUT CONTRAST  TECHNIQUE:  Multidetector CT imaging of the chest was performed following the  standard protocol without IV contrast.  COMPARISON: PET-CT 01/17/2013. Chest CT 12/15/2012.  FINDINGS:  Patient has undergone right upper lobe resection. There is  distortion of the right hilum with scarring and apparent  postsurgical change centrally in the right middle lobe. 5 mm right  lower lobe nodule on image number 33 was probably present previously  allowing for the postsurgical distortion of the right lung. There is  a stable small perifissural nodule in the mid left lung on image 32.  No suspicious pulmonary nodules are present.  There are no enlarged axillary or mediastinal lymph nodes. Right  hilar assessment is limited by the  lack of intravenous contrast and  adjacent postsurgical scarring. The heart size is normal. There is  stable mild atherosclerosis. No pleural or pericardial effusion is  present.  The visualized upper abdomen appears normal. There are no worrisome  osseous findings.  IMPRESSION:  1. Interval right upper lobe resection.  2. There is resulting distortion of the right hilum and right middle  lobe, but no evidence of local recurrence or metastatic disease.  3. Scattered small pulmonary nodules are stable.  Electronically Signed  By: Camie Patience M.D.  On: 08/29/2013 12:24    Impression: 63 year old woman who is now 6 months post thoracoscopic right upper  lobectomy. Overall she's doing well. She has no evidence recurrent disease.  She says that she's having fatigue, but not really shortness of breath with exertion. She does not have any chest pain or tightness associated with this fatigue. I am not really sure what to make of it. I don't know that is directly related to her surgery. She is not really having shortness of breath. There is no chest pain to suggest angina. She does have a history of COPD but she's not giving any history that seems consistent with wheezing. If anything she may at times be having brief episodes of stridor that resolve when she takes a couple of deep breaths.  Plan: Return in 5 months for a 1 year followup. We will do a CT of the chest at that time.

## 2014-01-23 ENCOUNTER — Other Ambulatory Visit: Payer: Self-pay | Admitting: *Deleted

## 2014-01-23 DIAGNOSIS — C3491 Malignant neoplasm of unspecified part of right bronchus or lung: Secondary | ICD-10-CM

## 2014-01-24 ENCOUNTER — Other Ambulatory Visit: Payer: Self-pay

## 2014-01-24 DIAGNOSIS — Z1231 Encounter for screening mammogram for malignant neoplasm of breast: Secondary | ICD-10-CM

## 2014-02-05 ENCOUNTER — Ambulatory Visit
Admission: RE | Admit: 2014-02-05 | Discharge: 2014-02-05 | Disposition: A | Discharge status: Home / Self Care | Payer: BC Managed Care – PPO | Source: Ambulatory Visit

## 2014-02-05 DIAGNOSIS — Z1231 Encounter for screening mammogram for malignant neoplasm of breast: Secondary | ICD-10-CM

## 2014-02-27 ENCOUNTER — Ambulatory Visit (INDEPENDENT_AMBULATORY_CARE_PROVIDER_SITE_OTHER): Payer: BLUE CROSS/BLUE SHIELD | Admitting: Thoracic Surgery (Cardiothoracic Vascular Surgery)

## 2014-02-27 ENCOUNTER — Encounter: Payer: Self-pay | Admitting: Thoracic Surgery (Cardiothoracic Vascular Surgery)

## 2014-02-27 ENCOUNTER — Ambulatory Visit
Admission: RE | Admit: 2014-02-27 | Discharge: 2014-02-27 | Disposition: A | Discharge status: Home / Self Care | Payer: BC Managed Care – PPO | Source: Ambulatory Visit | Attending: Thoracic Surgery (Cardiothoracic Vascular Surgery) | Admitting: Thoracic Surgery (Cardiothoracic Vascular Surgery)

## 2014-02-27 VITALS — BP 128/75 | HR 74 | Resp 20 | Ht 62.0 in | Wt 146.0 lb

## 2014-02-27 DIAGNOSIS — Z85118 Personal history of other malignant neoplasm of bronchus and lung: Secondary | ICD-10-CM

## 2014-02-27 DIAGNOSIS — C3491 Malignant neoplasm of unspecified part of right bronchus or lung: Secondary | ICD-10-CM

## 2014-02-27 DIAGNOSIS — Z9889 Other specified postprocedural states: Secondary | ICD-10-CM

## 2014-02-27 DIAGNOSIS — Z902 Acquired absence of lung [part of]: Secondary | ICD-10-CM

## 2014-02-27 NOTE — Progress Notes (Signed)
HPI:  Kristy Morrow turns today for her one-year follow-up visit.  She is a 64 year old former smoker disease quit in 2006) who underwent a thoracoscopic right upper lobectomy in January 2015. She had a T1 N0, stage IA non-small cell carcinoma. She was last seen in the office in August at which time she had no evidence of recurrent disease.  She says that she continues to do well. She's not having any pain. Her biggest complaint is excessive mucous production. She feels like she is always having to clear her throat. This is been a chronic problem for her but was worsened after the surgery. She feels that it has gotten better since she was here in August. Her weight is stable, her appetite is good. She has not had any unusual headaches or visual changes. She does not have any new bone or joint pain. She denies hemoptysis or wheezing.  Past Medical History  Diagnosis Date  . Unspecified essential hypertension   . Unspecified vitamin D deficiency   . Allergic rhinitis   . Pulmonary nodule, right     RUL 8-30mm GGO noted Sept 2011. Stable march 2012  . COPD (chronic obstructive pulmonary disease) sept 2011    seen on CT. Gold stage 0 on PFT Oct 2011  . Ovarian cancer aug 2010    s/p TAH BSO. Dr Robert Bellow. Stage 1A low grade cystadenocarinoma  . Non-small cell carcinoma of right lung, stage 1     s/p thoracoscopic RUL 02/24/2013      Current Outpatient Prescriptions  Medication Sig Dispense Refill  . cetirizine (ZYRTEC) 10 MG tablet Take 10 mg by mouth daily.    Marland Kitchen ibuprofen (ADVIL,MOTRIN) 200 MG tablet Take 200 mg by mouth every 8 (eight) hours as needed.     Marland Kitchen losartan (COZAAR) 50 MG tablet Take 50 mg by mouth daily.      . metoprolol (TOPROL-XL) 50 MG 24 hr tablet Take 1 tablet by mouth Daily.    . Multiple Vitamin (MULTIVITAMIN) capsule Take 1 capsule by mouth daily.      . Probiotic Product (ACIDOPHILUS/GOAT MILK) CAPS Take by mouth 2 (two) times daily.      No current facility-administered  medications for this visit.    Physical Exam BP 128/75 mmHg  Pulse 74  Resp 20  Ht 5\' 2"  (1.575 m)  Wt 146 lb (66.225 kg)  BMI 26.70 kg/m2  SpO73 75% 64 year old woman in no acute distress Alert and oriented 3 with no focal deficits No cervical or subclavicular adenopathy Lungs clear with equal breath sounds bilaterally  Diagnostic Tests: CT CHEST WITHOUT CONTRAST  TECHNIQUE: Multidetector CT imaging of the chest was performed following the standard protocol without IV contrast.  COMPARISON: 08/29/2013  FINDINGS: Mediastinum/Nodes: Aortic and branch vessel atherosclerosis. Normal heart size, accentuated by a mild pectus excavatum deformity. This distorts the appearance of the right atrium.  No mediastinal or definite hilar adenopathy, given limitations of unenhanced CT.  Lungs/Pleura: At least partial right upper lobectomy. Mild centrilobular emphysema.  5 mm right lower lobe nodule on image 34 is unchanged. Minimal thickening of the left major fissure is similar on image 33. Minimal anterior right lower lobe nodularity on image 26 is not significantly changed.  Similar minimal right lower lobe nodularity on image 24.  No new or enlarging pulmonary nodules.  No pleural fluid.  Upper abdomen: Normal imaged portions of the liver, spleen, stomach, pancreas, gallbladder, adrenal glands, kidneys.  Musculoskeletal: No acute osseous abnormality. C6-7 degenerative disc disease  is advanced.  IMPRESSION: 1. Similar appearance of right upper lobe wedge resection, without locally recurrent or metastatic disease. 2. No change in scattered tiny nonspecific pulmonary nodules. This is superimposed upon mild centrilobular emphysema.   Electronically Signed  By: Abigail Miyamoto M.D.  On: 02/27/2014 13:50  Impression: 64 year old woman who is now one year out from a thoracoscopic right upper lobectomy for a stage IA non-small cell carcinoma. She has no  evidence of recurrent disease. She does have some small scattered pulmonary nodules which are unchanged.  Overall she feels well.  She quit smoking in 2006  Plan:  Return in 6 months with CT of chest

## 2014-05-23 ENCOUNTER — Ambulatory Visit
Admission: RE | Admit: 2014-05-23 | Discharge: 2014-05-23 | Disposition: A | Discharge status: Home / Self Care | Payer: BLUE CROSS/BLUE SHIELD | Source: Ambulatory Visit | Attending: Family Medicine | Admitting: Family Medicine

## 2014-05-23 ENCOUNTER — Other Ambulatory Visit: Payer: Self-pay | Admitting: Family Medicine

## 2014-05-23 DIAGNOSIS — R05 Cough: Secondary | ICD-10-CM

## 2014-05-23 DIAGNOSIS — R053 Chronic cough: Secondary | ICD-10-CM

## 2014-08-13 ENCOUNTER — Other Ambulatory Visit: Payer: Self-pay | Admitting: Thoracic Surgery (Cardiothoracic Vascular Surgery)

## 2014-08-13 DIAGNOSIS — C3490 Malignant neoplasm of unspecified part of unspecified bronchus or lung: Secondary | ICD-10-CM

## 2014-08-13 DIAGNOSIS — C3410 Malignant neoplasm of upper lobe, unspecified bronchus or lung: Secondary | ICD-10-CM

## 2014-08-28 ENCOUNTER — Other Ambulatory Visit: Payer: BLUE CROSS/BLUE SHIELD

## 2014-08-28 ENCOUNTER — Ambulatory Visit: Payer: BLUE CROSS/BLUE SHIELD | Admitting: Thoracic Surgery (Cardiothoracic Vascular Surgery)

## 2014-09-25 ENCOUNTER — Encounter: Payer: Self-pay | Admitting: Thoracic Surgery (Cardiothoracic Vascular Surgery)

## 2014-09-25 ENCOUNTER — Ambulatory Visit
Admission: RE | Admit: 2014-09-25 | Discharge: 2014-09-25 | Disposition: A | Discharge status: Home / Self Care | Payer: BLUE CROSS/BLUE SHIELD | Source: Ambulatory Visit | Attending: Thoracic Surgery (Cardiothoracic Vascular Surgery) | Admitting: Thoracic Surgery (Cardiothoracic Vascular Surgery)

## 2014-09-25 ENCOUNTER — Ambulatory Visit (INDEPENDENT_AMBULATORY_CARE_PROVIDER_SITE_OTHER): Payer: BLUE CROSS/BLUE SHIELD | Admitting: Thoracic Surgery (Cardiothoracic Vascular Surgery)

## 2014-09-25 VITALS — BP 127/72 | HR 76 | Resp 19 | Ht 62.0 in | Wt 147.5 lb

## 2014-09-25 DIAGNOSIS — C3490 Malignant neoplasm of unspecified part of unspecified bronchus or lung: Secondary | ICD-10-CM

## 2014-09-25 NOTE — Progress Notes (Signed)
[address]       [place] [address]             [phone]       HPI: Mrs. Porrata for a scheduled 6 month follow-up visit.  She is a 64 year old former smoker (quit in 2006) who underwent a thoracoscopic right upper lobectomy in January 2015 for a T1 N0, stage IA non-small cell carcinoma. She was last seen in the office in January for her one-year follow-up visit. She had no evidence of recurrent disease.  Since her last visit she's had whooping cough. It took a while for that to get diagnosed, but once it was treated her cough resolved. Her weight is been stable. She has not had any wheezing or shortness of breath. She denies coughing. She's not had any unusual headaches, visual changes, bone or joint pain.  Past Medical History  Diagnosis Date  . Unspecified essential hypertension   . Unspecified vitamin D deficiency   . Allergic rhinitis   . Pulmonary nodule, right     RUL 8-30m GGO noted Sept 2011. Stable march 2012  . COPD (chronic obstructive pulmonary disease) sept 2011    seen on CT. Gold stage 0 on PFT Oct 2011  . Ovarian cancer aug 2010    s/p TAH BSO. Dr NRobert Bellow Stage 1A low grade cystadenocarinoma  . Non-small cell carcinoma of right lung, stage 1     s/p thoracoscopic RUL 02/24/2013        Current Outpatient Prescriptions  Medication Sig Dispense Refill  . Cholecalciferol (VITAMIN D) 2000 UNITS CAPS Take 2,000 Units by mouth.    .Marland Kitchenibuprofen (ADVIL,MOTRIN) 200 MG tablet Take 200 mg by mouth every 8 (eight) hours as needed.     .Marland Kitchenlosartan (COZAAR) 50 MG tablet Take 50 mg by mouth daily.      . metoprolol (TOPROL-XL) 50 MG 24 hr tablet Take 1 tablet by mouth Daily.    . Probiotic Product (ACIDOPHILUS/GOAT MILK) CAPS Take by mouth 2 (two) times daily.      No current facility-administered medications for this visit.    Physical Exam  BP 127/72 mmHg  Pulse 76  Resp 19  Ht '5\' 2"'$  (1.575 m)  Wt 147 lb 8 oz (66.906 kg)  BMI 26.97 kg/m2   SpO2 98% Well-appearing 64year old woman in no acute distress Alert and oriented 3 with no neurologic deficits No cervical or supraclavicular adenopathy Lungs clear with breath sounds bilaterally Cardiac regular rate and rhythm normal S1 and S2 No peripheral edema  Diagnostic Tests: CT CHEST WITHOUT CONTRAST  TECHNIQUE: Multidetector CT imaging of the chest was performed following the standard protocol without IV contrast.  COMPARISON: CT 02/27/2014.  FINDINGS: Mediastinum/Nodes: There are no enlarged mediastinal, hilar or axillary lymph nodes.Stable postoperative appearance of the right hilum. The thyroid gland, trachea and esophagus demonstrate no significant findings. The heart size is normal. There is no pericardial effusion. Stable mild aortic atherosclerosis.  Lungs/Pleura: There is no pleural effusion. Status post right upper lobe resection. Stable minimal pulmonary nodularity bilaterally. Stable ill-defined left upper lobe ground-glass density on image 15. No endobronchial lesion.  Upper abdomen: Stable, without suspicious findings.  Musculoskeletal/Chest wall: There is no chest wall mass or suspicious osseous finding.  IMPRESSION: Stable postoperative chest. No evidence of local recurrence or metastatic disease. Stable minimal pulmonary nodularity.   Electronically Signed  By: WRichardean SaleM.D.  On: 09/25/2014 13:52  I personally reviewed the CT chest and  concur with the findings as noted above.  Impression: Mrs. Ronal Fear is a 64 year old woman who is now 18 months out from a thoracoscopic right upper lobectomy for stage IA non-small cell carcinoma. She has no evidence recurrent disease. She continues to do well. She quit smoking 10 years ago.  Plan: I will plan to see her back in 6 months for her 2 year follow-up visit. We'll do a CT of the chest at that time.  Melrose Nakayama, MD [facility] [phone]

## 2014-12-12 DIAGNOSIS — Z1159 Encounter for screening for other viral diseases: Secondary | ICD-10-CM | POA: Diagnosis not present

## 2014-12-12 DIAGNOSIS — Z0000 Encounter for general adult medical examination without abnormal findings: Secondary | ICD-10-CM | POA: Diagnosis not present

## 2014-12-12 DIAGNOSIS — I10 Essential (primary) hypertension: Secondary | ICD-10-CM | POA: Diagnosis not present

## 2014-12-12 DIAGNOSIS — E785 Hyperlipidemia, unspecified: Secondary | ICD-10-CM | POA: Diagnosis not present

## 2014-12-19 DIAGNOSIS — Z23 Encounter for immunization: Secondary | ICD-10-CM | POA: Diagnosis not present

## 2015-02-22 DIAGNOSIS — Z23 Encounter for immunization: Secondary | ICD-10-CM | POA: Diagnosis not present

## 2015-03-12 ENCOUNTER — Other Ambulatory Visit: Payer: Self-pay | Admitting: Thoracic Surgery (Cardiothoracic Vascular Surgery)

## 2015-03-12 DIAGNOSIS — C3490 Malignant neoplasm of unspecified part of unspecified bronchus or lung: Secondary | ICD-10-CM

## 2015-03-21 ENCOUNTER — Other Ambulatory Visit (HOSPITAL_COMMUNITY)
Admission: RE | Admit: 2015-03-21 | Discharge: 2015-03-21 | Disposition: A | Discharge status: Home / Self Care | Payer: Medicare HMO | Source: Ambulatory Visit | Attending: Family Medicine | Admitting: Family Medicine

## 2015-03-21 ENCOUNTER — Other Ambulatory Visit: Payer: Self-pay

## 2015-03-21 DIAGNOSIS — R399 Unspecified symptoms and signs involving the genitourinary system: Secondary | ICD-10-CM | POA: Diagnosis not present

## 2015-03-21 DIAGNOSIS — R8299 Other abnormal findings in urine: Secondary | ICD-10-CM | POA: Diagnosis not present

## 2015-03-21 DIAGNOSIS — N898 Other specified noninflammatory disorders of vagina: Secondary | ICD-10-CM | POA: Diagnosis not present

## 2015-03-21 DIAGNOSIS — Z124 Encounter for screening for malignant neoplasm of cervix: Secondary | ICD-10-CM | POA: Diagnosis not present

## 2015-03-25 LAB — CYTOLOGY - PAP

## 2015-04-05 ENCOUNTER — Other Ambulatory Visit: Payer: Self-pay | Admitting: Thoracic Surgery (Cardiothoracic Vascular Surgery)

## 2015-04-09 ENCOUNTER — Ambulatory Visit (INDEPENDENT_AMBULATORY_CARE_PROVIDER_SITE_OTHER): Payer: Medicare HMO | Admitting: Thoracic Surgery (Cardiothoracic Vascular Surgery)

## 2015-04-09 ENCOUNTER — Encounter: Payer: Self-pay | Admitting: Thoracic Surgery (Cardiothoracic Vascular Surgery)

## 2015-04-09 ENCOUNTER — Ambulatory Visit
Admission: RE | Admit: 2015-04-09 | Discharge: 2015-04-09 | Disposition: A | Discharge status: Home / Self Care | Payer: Medicare HMO | Source: Ambulatory Visit | Attending: Thoracic Surgery (Cardiothoracic Vascular Surgery) | Admitting: Thoracic Surgery (Cardiothoracic Vascular Surgery)

## 2015-04-09 VITALS — BP 120/73 | HR 72 | Resp 20 | Ht 62.0 in | Wt 145.0 lb

## 2015-04-09 DIAGNOSIS — R911 Solitary pulmonary nodule: Secondary | ICD-10-CM | POA: Diagnosis not present

## 2015-04-09 DIAGNOSIS — Z902 Acquired absence of lung [part of]: Secondary | ICD-10-CM | POA: Diagnosis not present

## 2015-04-09 DIAGNOSIS — C3490 Malignant neoplasm of unspecified part of unspecified bronchus or lung: Secondary | ICD-10-CM

## 2015-04-09 DIAGNOSIS — Z85118 Personal history of other malignant neoplasm of bronchus and lung: Secondary | ICD-10-CM

## 2015-04-09 NOTE — Progress Notes (Signed)
[address]       [place] [address]             [phone]       HPI: Kristy Morrow returns today for scheduled 6 month follow-up visit.  She is a 65 year old former smoker (quit in 2006) who underwent a thoracoscopic right upper lobectomy in January 2015 for a T1 N0, stage IA non-small cell carcinoma.   She did not require any adjuvant treatment.  I last saw her in the office in August. At that time she was doing well with no evidence recurrent disease.  She denies change in appetite or weight loss. She denies unusual cough or hemoptysis. She denies any unusual headaches, visual changes, bone or joint pain. She denies chest pain or shortness of breath. She does complain of wheezing, which she attributes to allergies.  Past Medical History  Diagnosis Date  . Unspecified essential hypertension   . Unspecified vitamin D deficiency   . Allergic rhinitis   . Pulmonary nodule, right     RUL 8-58m GGO noted Sept 2011. Stable march 2012  . [place]    seen on CT. Gold stage 0 on PFT Oct 2011  . [place]varian cancer [place] aug 2010    s/p TAH BSO. Dr NRobert Bellow Stage 1A low grade cystadenocarinoma  . Non-small cell carcinoma of right lung, stage 1 ([facility])     s/p thoracoscopic RUL 02/24/2013     Current Outpatient Prescriptions  Medication Sig Dispense Refill  . Cholecalciferol (VITAMIN D) 2000 UNITS CAPS Take 2,000 Units by mouth.    .Marland Kitchenibuprofen (ADVIL,MOTRIN) 200 MG tablet Take 200 mg by mouth every 8 (eight) hours as needed.     .Marland Kitchenlosartan (COZAAR) 50 MG tablet Take 50 mg by mouth daily.      . metoprolol (TOPROL-XL) 50 MG 24 hr tablet Take 1 tablet by mouth Daily.    . Probiotic Product (ACIDOPHILUS/GOAT MILK) CAPS Take by mouth 2 (two) times daily.      No current facility-administered medications for this visit.    Physical Exam  Diagnostic Tests: CT CHEST WITHOUT CONTRAST  TECHNIQUE: Multidetector CT  imaging of the chest was performed following the standard protocol without IV contrast.  COMPARISON: 09/25/2014.  FINDINGS: Mediastinum/Nodes: Heart is normal in size. No pericardial effusion.  Mild atherosclerotic calcifications aortic arch.  No suspicious mediastinal lymphadenopathy.  Visualized thyroid is unremarkable.  Lungs/Pleura: Status post right upper lobectomy.  Stable 2 x 3 mm nodule in the anterior right lower lobe (series 5/ image 26). Stable 3 mm nodule in the lateral right lower lobe (series 5/ image 34).  No new/ suspicious pulmonary nodules. No focal consolidation.  No pleural effusion or pneumothorax.  Upper abdomen: Visualized upper abdomen is notable for vascular calcifications.  Musculoskeletal: Degenerative changes of the visualized thoracolumbar spine.  IMPRESSION: Status post right upper lobectomy.  No evidence of recurrent or metastatic disease.   Electronically Signed  By: SJulian HyM.D.  On: 04/09/2015 12:06  I personally reviewed the CT chest and concur with the findings as noted above  Impression: 65year old woman who is now 2 years out from a thoracoscopic right upper lobectomy for a stage IA non-small cell carcinoma the lung (adenocarcinoma). She has no evidence recurrent disease. At this point the NCCN guidelines recommend annual CT scan follow-up for the next 3 years.  Currently she is doing well.  She does have some wheezing  which she attributes to allergies. She is looking for an allergist currently.  She quit smoking over 10 years ago.  Plan: Return in one year with CT chest  Melrose Nakayama, MD [facility] [phone]

## 2015-06-12 ENCOUNTER — Other Ambulatory Visit: Payer: Self-pay | Admitting: Family Medicine

## 2015-06-12 DIAGNOSIS — R0989 Other specified symptoms and signs involving the circulatory and respiratory systems: Secondary | ICD-10-CM | POA: Diagnosis not present

## 2015-06-12 DIAGNOSIS — E785 Hyperlipidemia, unspecified: Secondary | ICD-10-CM | POA: Diagnosis not present

## 2015-06-12 DIAGNOSIS — I10 Essential (primary) hypertension: Secondary | ICD-10-CM | POA: Diagnosis not present

## 2015-06-12 DIAGNOSIS — E538 Deficiency of other specified B group vitamins: Secondary | ICD-10-CM | POA: Diagnosis not present

## 2015-06-12 DIAGNOSIS — J449 Chronic obstructive pulmonary disease, unspecified: Secondary | ICD-10-CM | POA: Diagnosis not present

## 2015-06-17 ENCOUNTER — Ambulatory Visit
Admission: RE | Admit: 2015-06-17 | Discharge: 2015-06-17 | Disposition: A | Discharge status: Home / Self Care | Payer: Medicare HMO | Source: Ambulatory Visit | Attending: Family Medicine | Admitting: Family Medicine

## 2015-06-17 DIAGNOSIS — R0989 Other specified symptoms and signs involving the circulatory and respiratory systems: Secondary | ICD-10-CM

## 2015-06-17 DIAGNOSIS — I6523 Occlusion and stenosis of bilateral carotid arteries: Secondary | ICD-10-CM | POA: Diagnosis not present

## 2015-06-18 DIAGNOSIS — H52223 Regular astigmatism, bilateral: Secondary | ICD-10-CM | POA: Diagnosis not present

## 2015-06-18 DIAGNOSIS — H25093 Other age-related incipient cataract, bilateral: Secondary | ICD-10-CM | POA: Diagnosis not present

## 2015-06-18 DIAGNOSIS — H524 Presbyopia: Secondary | ICD-10-CM | POA: Diagnosis not present

## 2015-06-18 DIAGNOSIS — H43399 Other vitreous opacities, unspecified eye: Secondary | ICD-10-CM | POA: Diagnosis not present

## 2015-06-18 DIAGNOSIS — I10 Essential (primary) hypertension: Secondary | ICD-10-CM | POA: Diagnosis not present

## 2015-06-18 DIAGNOSIS — H5201 Hypermetropia, right eye: Secondary | ICD-10-CM | POA: Diagnosis not present

## 2015-06-18 DIAGNOSIS — Z0100 Encounter for examination of eyes and vision without abnormal findings: Secondary | ICD-10-CM | POA: Diagnosis not present

## 2015-06-18 DIAGNOSIS — H5212 Myopia, left eye: Secondary | ICD-10-CM | POA: Diagnosis not present

## 2015-08-05 IMAGING — PT NM PET TUM IMG INITIAL (PI) SKULL BASE T - THIGH
1 of 6 series · 1 of 25 positions shown · non-contrast
Comparison: Chest CT 12/15/2012.

CLINICAL DATA: Initial treatment strategy for right upper lung
nodule..

EXAM:
NUCLEAR MEDICINE PET SKULL BASE TO THIGH
FASTING BLOOD GLUCOSE:  Value: 98mg/dl
TECHNIQUE: 17.6 mCi F-18 FDG was injected intravenously. CT data was obtained
and used for attenuation correction and anatomic localization only.
(This was not acquired as a diagnostic CT examination.) Additional
exam technical data entered on technologist worksheet.

[Series 2: ct images · axial · 3.8mm · 0.98mm/px · 1 of 267 slices shown]
[im 267/267  brain]
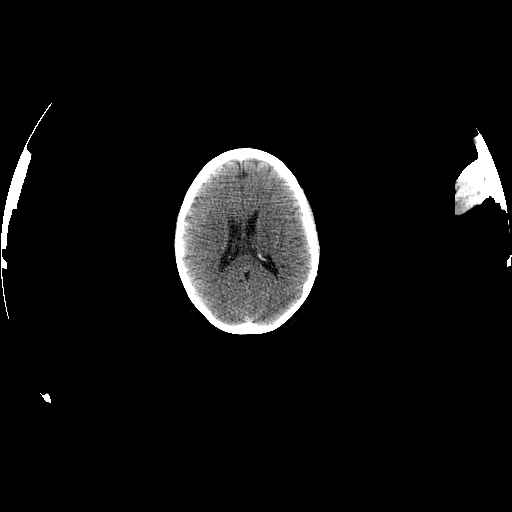

[1 of 25 positions shown; findings below may reference images not displayed]

FINDINGS: NECK

No hypermetabolic lymph nodes in the neck.

CHEST

The previously noted sub solid nodule in the posterior aspect of the
right upper lobe currently measures approximately 14 x 12 mm (image
71 of series 2), with a central solid component estimated to measure
approximately 9 mm on today's examination. This lesion does not
demonstrate definite hypermetabolism (SUVmax = 1.9). Previously
described ground-glass attenuation nodules are otherwise not well
visualized on today's non breath-held CT examination. No
hypermetabolic mediastinal or hilar nodes.

ABDOMEN/PELVIS

No abnormal hypermetabolic activity within the liver, pancreas,
adrenal glands, or spleen. No hypermetabolic lymph nodes in the
abdomen or pelvis. Atherosclerosis throughout the abdominal and
pelvic vasculature, without definite aneurysm. No significant volume
of ascites, no pneumoperitoneum no pathologic distention small
status post hysterectomy ovaries are not confidently identified may
be surgically absent atrophic.

SKELETON

No focal hypermetabolic activity to suggest skeletal metastasis.
IMPRESSION: 1. Previously described subsolid nodule the posterior aspect of the
right upper lobe demonstrates no convincing hypermetabolism on
today's examination. However, the persistence of this nodule, and
the developing central solid component of the nodule are concerning
for slow-growing neoplasm such as adenocarcinoma, which can
demonstrate only low level metabolism on PET imaging. Accordingly,
further evaluation with biopsy or surgical resection may warrant
further consideration.

## 2015-09-13 ENCOUNTER — Other Ambulatory Visit: Payer: Self-pay | Admitting: Family Medicine

## 2015-09-13 DIAGNOSIS — Z1231 Encounter for screening mammogram for malignant neoplasm of breast: Secondary | ICD-10-CM

## 2015-09-24 ENCOUNTER — Ambulatory Visit
Admission: RE | Admit: 2015-09-24 | Discharge: 2015-09-24 | Disposition: A | Discharge status: Home / Self Care | Payer: Medicare HMO | Source: Ambulatory Visit | Attending: Family Medicine | Admitting: Family Medicine

## 2015-09-24 DIAGNOSIS — Z1231 Encounter for screening mammogram for malignant neoplasm of breast: Secondary | ICD-10-CM

## 2015-10-21 NOTE — Telephone Encounter (Signed)
This encounter was created in error - please disregard.

## 2015-12-27 DIAGNOSIS — Z23 Encounter for immunization: Secondary | ICD-10-CM | POA: Diagnosis not present

## 2015-12-27 DIAGNOSIS — I10 Essential (primary) hypertension: Secondary | ICD-10-CM | POA: Diagnosis not present

## 2016-03-12 ENCOUNTER — Other Ambulatory Visit: Payer: Self-pay | Admitting: Thoracic Surgery (Cardiothoracic Vascular Surgery)

## 2016-03-12 DIAGNOSIS — C3490 Malignant neoplasm of unspecified part of unspecified bronchus or lung: Secondary | ICD-10-CM

## 2016-04-14 ENCOUNTER — Ambulatory Visit (INDEPENDENT_AMBULATORY_CARE_PROVIDER_SITE_OTHER): Payer: Medicare HMO | Admitting: Thoracic Surgery (Cardiothoracic Vascular Surgery)

## 2016-04-14 ENCOUNTER — Ambulatory Visit
Admission: RE | Admit: 2016-04-14 | Discharge: 2016-04-14 | Disposition: A | Discharge status: Home / Self Care | Payer: Medicare HMO | Source: Ambulatory Visit | Attending: Thoracic Surgery (Cardiothoracic Vascular Surgery) | Admitting: Thoracic Surgery (Cardiothoracic Vascular Surgery)

## 2016-04-14 ENCOUNTER — Encounter: Payer: Self-pay | Admitting: Thoracic Surgery (Cardiothoracic Vascular Surgery)

## 2016-04-14 VITALS — BP 140/80 | HR 54 | Resp 16 | Ht 62.0 in | Wt 147.4 lb

## 2016-04-14 DIAGNOSIS — C3490 Malignant neoplasm of unspecified part of unspecified bronchus or lung: Secondary | ICD-10-CM

## 2016-04-14 DIAGNOSIS — Z85118 Personal history of other malignant neoplasm of bronchus and lung: Secondary | ICD-10-CM

## 2016-04-14 DIAGNOSIS — Z902 Acquired absence of lung [part of]: Secondary | ICD-10-CM

## 2016-04-14 DIAGNOSIS — C3411 Malignant neoplasm of upper lobe, right bronchus or lung: Secondary | ICD-10-CM | POA: Diagnosis not present

## 2016-04-14 NOTE — Progress Notes (Signed)
[address]       [place] [address]             [phone]    HPI: Mrs. Mccahill returns for a scheduled annual follow-up visit  She is a 66 year old former smoker (quit in 2006) who underwent a thoracoscopic right upper lobectomy in January 2015 for a T1 N0, stage IA non-small cell carcinoma.   She did not require any adjuvant treatment.  I last saw her in the office in February 2017. At that time she was doing well with no evidence recurrent disease.  She denies change in appetite or weight loss. She denies unusual cough or hemoptysis. She denies any unusual headaches, visual changes, bone or joint pain. She denies chest pain or shortness of breath.  She continues to have a lot of congestion due to allergies with occasional wheezing. Past Medical History:  Diagnosis Date  . Allergic rhinitis   . COPD (chronic obstructive pulmonary disease) ([place]) sept 2011   seen on CT. Gold stage 0 on PFT Oct 2011  . Non-small cell carcinoma of right lung, stage 1 ([place])    s/p thoracoscopic RUL 02/24/2013  . Ovarian cancer ([place]) aug 2010   s/p TAH BSO. Dr Robert Bellow. Stage 1A low grade cystadenocarinoma  . Pulmonary nodule, right    RUL 8-33m GGO noted Sept 2011. Stable march 2012  . Unspecified essential hypertension   . Unspecified vitamin D deficiency      Current Outpatient Prescriptions  Medication Sig Dispense Refill  . Cholecalciferol (VITAMIN D) 2000 UNITS CAPS Take 2,000 Units by mouth.    .Marland Kitchenibuprofen (ADVIL,MOTRIN) 200 MG tablet Take 200 mg by mouth every 8 (eight) hours as needed.     .Marland Kitchenlosartan (COZAAR) 50 MG tablet Take 50 mg by mouth daily.      . metoprolol (TOPROL-XL) 50 MG 24 hr tablet Take 1 tablet by mouth Daily.    . Probiotic Product (ACIDOPHILUS/GOAT MILK) CAPS Take by mouth 2 (two) times daily.      No current facility-administered medications for this visit.    Review of systems See history of present illness  Physical Exam BP 140/80 (BP  Location: Left Arm, Patient Position: Sitting, Cuff Size: Large)   Pulse (!) 54   Resp 16   Ht '5\' 2"'$  (1.575 m)   Wt 147 lb 6.4 oz (66.9 kg)   SpO2 98% Comment: ON RA  BMI 26.941kg/m  66year old woman in no acute distress Alert and oriented 3 with no focal deficits No cervical or supraclavicular adenopathy Cardiac regular rate and rhythm normal S1 and S2 Lungs clear bilaterally Incisions well healed  Diagnostic Tests: CT CHEST WITHOUT CONTRAST  TECHNIQUE: Multidetector CT imaging of the chest was performed following the standard protocol without IV contrast.  COMPARISON:  04/09/2015  FINDINGS: Cardiovascular: Mild atherosclerotic calcifications of thoracic aorta. Heart size within normal limits. Trace anterior pericardial effusion or thickening. No aortic aneurysm.  Mediastinum/Nodes: Central airways are patent. No mediastinal or hilar adenopathy on this unenhanced scan.  Lungs/Pleura: Stable postsurgical changes post right upper lobectomy. Minimal right hilar anterior peribronchial scarring. There is no evidence of recurrent mass. Axial images 49 stable 3 mm nodule in right lower lobe anteriorly. Axial image 30 stable 3 mm nodule in right upper lobe anterolaterally. No new pulmonary nodules are noted. No infiltrate or pulmonary edema. No pleural plaques or pleural thickening. Stable minimal bilateral apical scarring.  Upper Abdomen: The visualized upper abdomen  shows no adrenal gland mass. Visualized unenhanced liver, pancreas, spleen is unremarkable. Mild atherosclerotic calcifications of abdominal aorta.  Musculoskeletal: No destructive bony lesions are noted. Sagittal images of the spine shows stable mild degenerative changes thoracic spine. Sagittal view of the sternum is unremarkable.  IMPRESSION: 1. Stable postsurgical changes post right upper lobectomy. No evidence of recurrent mass or adenopathy. Stable two tiny 3 mm nodules in right lower lobe. No  new pulmonary nodule. 2. No infiltrate or pulmonary edema. Trace anterior pericardial effusion or thickening. 3. Stable mild degenerative changes thoracic spine.   Electronically Signed   By: Lahoma Crocker M.D.   On: 04/14/2016 10:00 I personally reviewed the CT chest and concur with the findings noted above  Impression: 66 year old woman who is 3 years out from a thoracoscopic right upper lobectomy for stage IA non-small cell carcinoma. She has no evidence of recurrent disease. She willl need annual CT scans out to 5 years, although her risk of recurrence is decreasing all the time.  Tobacco abuse- quit in 2006 with no relapse  Allergies - managed by primary physician.  Plan: Return in one year with CT chest for 4 year follow-up  Melrose Nakayama, MD [facility] [phone]

## 2016-04-29 DIAGNOSIS — L700 Acne vulgaris: Secondary | ICD-10-CM | POA: Diagnosis not present

## 2016-04-29 DIAGNOSIS — L570 Actinic keratosis: Secondary | ICD-10-CM | POA: Diagnosis not present

## 2016-04-29 DIAGNOSIS — L821 Other seborrheic keratosis: Secondary | ICD-10-CM | POA: Diagnosis not present

## 2016-04-29 DIAGNOSIS — L814 Other melanin hyperpigmentation: Secondary | ICD-10-CM | POA: Diagnosis not present

## 2016-04-29 DIAGNOSIS — D225 Melanocytic nevi of trunk: Secondary | ICD-10-CM | POA: Diagnosis not present

## 2016-06-29 DIAGNOSIS — J3081 Allergic rhinitis due to animal (cat) (dog) hair and dander: Secondary | ICD-10-CM | POA: Diagnosis not present

## 2016-06-29 DIAGNOSIS — J301 Allergic rhinitis due to pollen: Secondary | ICD-10-CM | POA: Diagnosis not present

## 2016-06-29 DIAGNOSIS — J4530 Mild persistent asthma, uncomplicated: Secondary | ICD-10-CM | POA: Diagnosis not present

## 2016-06-29 DIAGNOSIS — R062 Wheezing: Secondary | ICD-10-CM | POA: Diagnosis not present

## 2016-09-14 ENCOUNTER — Other Ambulatory Visit: Payer: Self-pay | Admitting: Family Medicine

## 2016-09-14 DIAGNOSIS — Z1231 Encounter for screening mammogram for malignant neoplasm of breast: Secondary | ICD-10-CM

## 2016-09-22 DIAGNOSIS — J4530 Mild persistent asthma, uncomplicated: Secondary | ICD-10-CM | POA: Diagnosis not present

## 2016-09-22 DIAGNOSIS — J301 Allergic rhinitis due to pollen: Secondary | ICD-10-CM | POA: Diagnosis not present

## 2016-09-22 DIAGNOSIS — J3089 Other allergic rhinitis: Secondary | ICD-10-CM | POA: Diagnosis not present

## 2016-09-22 DIAGNOSIS — J3081 Allergic rhinitis due to animal (cat) (dog) hair and dander: Secondary | ICD-10-CM | POA: Diagnosis not present

## 2016-09-29 DIAGNOSIS — I10 Essential (primary) hypertension: Secondary | ICD-10-CM | POA: Diagnosis not present

## 2016-09-29 DIAGNOSIS — J4540 Moderate persistent asthma, uncomplicated: Secondary | ICD-10-CM | POA: Diagnosis not present

## 2016-09-29 DIAGNOSIS — Z85118 Personal history of other malignant neoplasm of bronchus and lung: Secondary | ICD-10-CM | POA: Diagnosis not present

## 2016-09-29 DIAGNOSIS — Z79899 Other long term (current) drug therapy: Secondary | ICD-10-CM | POA: Diagnosis not present

## 2016-09-29 DIAGNOSIS — E538 Deficiency of other specified B group vitamins: Secondary | ICD-10-CM | POA: Diagnosis not present

## 2016-09-29 DIAGNOSIS — Z8543 Personal history of malignant neoplasm of ovary: Secondary | ICD-10-CM | POA: Diagnosis not present

## 2016-10-07 ENCOUNTER — Ambulatory Visit
Admission: RE | Admit: 2016-10-07 | Discharge: 2016-10-07 | Disposition: A | Discharge status: Home / Self Care | Payer: Medicare HMO | Source: Ambulatory Visit | Attending: Family Medicine | Admitting: Family Medicine

## 2016-10-07 ENCOUNTER — Ambulatory Visit: Payer: Medicare HMO

## 2016-10-07 DIAGNOSIS — Z1231 Encounter for screening mammogram for malignant neoplasm of breast: Secondary | ICD-10-CM | POA: Diagnosis not present

## 2016-11-06 DIAGNOSIS — Z23 Encounter for immunization: Secondary | ICD-10-CM | POA: Diagnosis not present

## 2016-11-23 DIAGNOSIS — J4530 Mild persistent asthma, uncomplicated: Secondary | ICD-10-CM | POA: Diagnosis not present

## 2016-11-23 DIAGNOSIS — J3081 Allergic rhinitis due to animal (cat) (dog) hair and dander: Secondary | ICD-10-CM | POA: Diagnosis not present

## 2016-11-23 DIAGNOSIS — J301 Allergic rhinitis due to pollen: Secondary | ICD-10-CM | POA: Diagnosis not present

## 2016-11-23 DIAGNOSIS — J209 Acute bronchitis, unspecified: Secondary | ICD-10-CM | POA: Diagnosis not present

## 2016-11-30 ENCOUNTER — Other Ambulatory Visit: Payer: Self-pay | Admitting: Allergy and Immunology

## 2016-11-30 ENCOUNTER — Ambulatory Visit
Admission: RE | Admit: 2016-11-30 | Discharge: 2016-11-30 | Disposition: A | Discharge status: Home / Self Care | Payer: Medicare HMO | Source: Ambulatory Visit | Attending: Allergy and Immunology | Admitting: Allergy and Immunology

## 2016-11-30 DIAGNOSIS — J209 Acute bronchitis, unspecified: Secondary | ICD-10-CM

## 2016-11-30 DIAGNOSIS — R05 Cough: Secondary | ICD-10-CM | POA: Diagnosis not present

## 2017-01-13 DIAGNOSIS — H524 Presbyopia: Secondary | ICD-10-CM | POA: Diagnosis not present

## 2017-01-13 DIAGNOSIS — Z0100 Encounter for examination of eyes and vision without abnormal findings: Secondary | ICD-10-CM | POA: Diagnosis not present

## 2017-02-15 DIAGNOSIS — J3089 Other allergic rhinitis: Secondary | ICD-10-CM | POA: Diagnosis not present

## 2017-02-15 DIAGNOSIS — J301 Allergic rhinitis due to pollen: Secondary | ICD-10-CM | POA: Diagnosis not present

## 2017-02-15 DIAGNOSIS — J4530 Mild persistent asthma, uncomplicated: Secondary | ICD-10-CM | POA: Diagnosis not present

## 2017-02-15 DIAGNOSIS — J3081 Allergic rhinitis due to animal (cat) (dog) hair and dander: Secondary | ICD-10-CM | POA: Diagnosis not present

## 2017-03-01 DIAGNOSIS — J4530 Mild persistent asthma, uncomplicated: Secondary | ICD-10-CM | POA: Diagnosis not present

## 2017-03-01 DIAGNOSIS — J3081 Allergic rhinitis due to animal (cat) (dog) hair and dander: Secondary | ICD-10-CM | POA: Diagnosis not present

## 2017-03-01 DIAGNOSIS — J3089 Other allergic rhinitis: Secondary | ICD-10-CM | POA: Diagnosis not present

## 2017-03-01 DIAGNOSIS — J301 Allergic rhinitis due to pollen: Secondary | ICD-10-CM | POA: Diagnosis not present

## 2017-03-04 ENCOUNTER — Other Ambulatory Visit: Payer: Self-pay | Admitting: *Deleted

## 2017-03-04 DIAGNOSIS — Z85118 Personal history of other malignant neoplasm of bronchus and lung: Secondary | ICD-10-CM

## 2017-03-05 DIAGNOSIS — Z0000 Encounter for general adult medical examination without abnormal findings: Secondary | ICD-10-CM | POA: Diagnosis not present

## 2017-03-05 DIAGNOSIS — Z13220 Encounter for screening for lipoid disorders: Secondary | ICD-10-CM | POA: Diagnosis not present

## 2017-03-05 DIAGNOSIS — I10 Essential (primary) hypertension: Secondary | ICD-10-CM | POA: Diagnosis not present

## 2017-03-05 DIAGNOSIS — Z8543 Personal history of malignant neoplasm of ovary: Secondary | ICD-10-CM | POA: Diagnosis not present

## 2017-03-05 DIAGNOSIS — J4540 Moderate persistent asthma, uncomplicated: Secondary | ICD-10-CM | POA: Diagnosis not present

## 2017-03-05 DIAGNOSIS — Z85118 Personal history of other malignant neoplasm of bronchus and lung: Secondary | ICD-10-CM | POA: Diagnosis not present

## 2017-03-05 DIAGNOSIS — Z79899 Other long term (current) drug therapy: Secondary | ICD-10-CM | POA: Diagnosis not present

## 2017-04-13 ENCOUNTER — Encounter: Payer: Self-pay | Admitting: Thoracic Surgery (Cardiothoracic Vascular Surgery)

## 2017-04-13 ENCOUNTER — Ambulatory Visit: Payer: Medicare HMO | Admitting: Thoracic Surgery (Cardiothoracic Vascular Surgery)

## 2017-04-13 ENCOUNTER — Ambulatory Visit
Admission: RE | Admit: 2017-04-13 | Discharge: 2017-04-13 | Disposition: A | Discharge status: Home / Self Care | Payer: Medicare HMO | Source: Ambulatory Visit | Attending: Thoracic Surgery (Cardiothoracic Vascular Surgery) | Admitting: Thoracic Surgery (Cardiothoracic Vascular Surgery)

## 2017-04-13 ENCOUNTER — Other Ambulatory Visit: Payer: Self-pay

## 2017-04-13 VITALS — BP 155/79 | HR 61 | Resp 18 | Ht 62.0 in | Wt 155.0 lb

## 2017-04-13 DIAGNOSIS — R918 Other nonspecific abnormal finding of lung field: Secondary | ICD-10-CM | POA: Diagnosis not present

## 2017-04-13 DIAGNOSIS — Z85118 Personal history of other malignant neoplasm of bronchus and lung: Secondary | ICD-10-CM | POA: Diagnosis not present

## 2017-04-13 DIAGNOSIS — Z902 Acquired absence of lung [part of]: Secondary | ICD-10-CM

## 2017-04-13 NOTE — Progress Notes (Signed)
[address]       [place] [address]             [phone]     HPI: Kristy Morrow returns for a scheduled follow-up visit  Kristy Morrow is a 67 year old former smoker who had a thoracoscopic right upper lobectomy for a T1, N0, stage IA non-small cell carcinoma in January 2015.  She also has a history of hypertension, stage IA ovarian cancer, allergies and COPD.  I last saw her in the office in February 2018.  She was doing well at that time with no evidence of recurrent disease.  In the interim since her last visit she is been doing well.  She says her allergies have improved with her current regimen.  She has not had any issues with her breathing.  She denies cough, hemoptysis, wheezing, shortness of breath.  She has not had any change in appetite or weight loss.  She has not had any unusual headaches, visual changes, or bone pain.  Past Medical History:  Diagnosis Date  . Allergic rhinitis   . COPD (chronic obstructive pulmonary disease) ([place]) sept 2011   seen on CT. Gold stage 0 on PFT Oct 2011  . Non-small cell carcinoma of right lung, stage 1 ([place])    s/p thoracoscopic RUL 02/24/2013  . Ovarian cancer ([place]) aug 2010   s/p TAH BSO. Dr Robert Bellow. Stage 1A low grade cystadenocarinoma  . Pulmonary nodule, right    RUL 8-58mm GGO noted Sept 2011. Stable march 2012  . Unspecified essential hypertension   . Unspecified vitamin D deficiency     Current Outpatient Medications  Medication Sig Dispense Refill  . Albuterol (VENTOLIN IN) Inhale 1 puff into the lungs as needed.    . budesonide-formoterol (SYMBICORT) 80-4.5 MCG/ACT inhaler Inhale 2 puffs into the lungs 2 (two) times daily.    . Cholecalciferol (VITAMIN D) 2000 UNITS CAPS Take 2,000 Units by mouth.    Marland Kitchen ibuprofen (ADVIL,MOTRIN) 200 MG tablet Take 200 mg by mouth every 8 (eight) hours as needed.     Marland Kitchen levocetirizine (XYZAL) 2.5 MG/5ML solution Take 2.5 mg by mouth every evening.    Marland Kitchen losartan (COZAAR) 50 MG  tablet Take 50 mg by mouth daily.      . metoprolol (TOPROL-XL) 50 MG 24 hr tablet Take 1 tablet by mouth Daily.     No current facility-administered medications for this visit.     Physical Exam BP (!) 155/79 (BP Location: Left Arm, Patient Position: Sitting, Cuff Size: Normal)   Pulse 61   Resp 18   Ht 5\' 2"  (1.575 m)   Wt 155 lb (70.3 kg)   SpO2 99% Comment: RA  BMI 28.35 kg/m  67 year old woman in no acute distress Alert and oriented x3 with no focal deficits No cervical or supraclavicular adenopathy Lungs clear with equal breath sounds bilaterally Incisions well-healed Cardiac regular rate and rhythm normal S1 and S2  Diagnostic Tests: CT CHEST WITHOUT CONTRAST  TECHNIQUE: Multidetector CT imaging of the chest was performed following the standard protocol without IV contrast.  COMPARISON:  04/14/2016 chest CT.  FINDINGS: Cardiovascular: Normal heart size. Stable trace pericardial effusion/thickening. Atherosclerotic nonaneurysmal thoracic aorta. Normal caliber pulmonary arteries.  Mediastinum/Nodes: No discrete thyroid nodules. Unremarkable esophagus. No pathologically enlarged axillary, mediastinal or gross hilar lymph nodes, noting limited sensitivity for the detection of hilar adenopathy on this noncontrast study.  Lungs/Pleura: No pneumothorax. No pleural effusion. Status post right upper lobectomy.  No acute consolidative airspace disease or lung masses. Several small scattered solid pulmonary nodules in both lungs, largest 5 mm in the right lower lobe (series 3/image 86) and 5 mm in the lingula (series 3/image 95), all stable. Stable 8 mm left upper lobe ground-glass nodule (series 3/image 41). No new significant pulmonary nodules.  Upper abdomen: No acute abnormality.  Musculoskeletal: No aggressive appearing focal osseous lesions. Mild thoracic spondylosis.  IMPRESSION: 1. No findings suspicious for local tumor recurrence in the right lung  status post right upper lobectomy. 2. No findings suspicious for metastatic disease in the chest. 3. Interval stability of several scattered small pulmonary nodules in both lungs, probably benign. Continued chest CT surveillance advised. 4. Stable trace pericardial effusion/thickening.  Aortic Atherosclerosis (ICD10-I70.0).   Electronically Signed   By: Ilona Sorrel M.D.   On: 04/13/2017 11:51 I personally reviewed the CT chest and concur with the findings noted above  Impression: Kristy Morrow is a 67 year old former smoker who had a thoracoscopic right upper lobectomy for stage IA non-small cell carcinoma 4 years ago.  She has no evidence of recurrent disease.  Remote tobacco abuse-quit in 2006.  He does not have cravings.  Pulmonary nodules-multiple small 2-3 mm pulmonary nodules.  Stable over time.  No suspicious nodules.  Plan: Return in 1 year with CT chest for 5-year follow-up.  Melrose Nakayama, MD [facility] [phone]

## 2017-04-29 DIAGNOSIS — L821 Other seborrheic keratosis: Secondary | ICD-10-CM | POA: Diagnosis not present

## 2017-04-29 DIAGNOSIS — L819 Disorder of pigmentation, unspecified: Secondary | ICD-10-CM | POA: Diagnosis not present

## 2017-04-29 DIAGNOSIS — L814 Other melanin hyperpigmentation: Secondary | ICD-10-CM | POA: Diagnosis not present

## 2017-04-29 DIAGNOSIS — D229 Melanocytic nevi, unspecified: Secondary | ICD-10-CM | POA: Diagnosis not present

## 2017-06-03 DIAGNOSIS — Z8543 Personal history of malignant neoplasm of ovary: Secondary | ICD-10-CM | POA: Diagnosis not present

## 2017-06-03 DIAGNOSIS — Z01419 Encounter for gynecological examination (general) (routine) without abnormal findings: Secondary | ICD-10-CM | POA: Diagnosis not present

## 2017-06-14 DIAGNOSIS — J4530 Mild persistent asthma, uncomplicated: Secondary | ICD-10-CM | POA: Diagnosis not present

## 2017-06-14 DIAGNOSIS — J3081 Allergic rhinitis due to animal (cat) (dog) hair and dander: Secondary | ICD-10-CM | POA: Diagnosis not present

## 2017-06-14 DIAGNOSIS — J301 Allergic rhinitis due to pollen: Secondary | ICD-10-CM | POA: Diagnosis not present

## 2017-06-14 DIAGNOSIS — J4541 Moderate persistent asthma with (acute) exacerbation: Secondary | ICD-10-CM | POA: Diagnosis not present

## 2017-08-31 DIAGNOSIS — E7800 Pure hypercholesterolemia, unspecified: Secondary | ICD-10-CM | POA: Diagnosis not present

## 2017-09-21 DIAGNOSIS — Z23 Encounter for immunization: Secondary | ICD-10-CM | POA: Diagnosis not present

## 2017-11-09 DIAGNOSIS — J4540 Moderate persistent asthma, uncomplicated: Secondary | ICD-10-CM | POA: Diagnosis not present

## 2017-11-09 DIAGNOSIS — J301 Allergic rhinitis due to pollen: Secondary | ICD-10-CM | POA: Diagnosis not present

## 2017-11-09 DIAGNOSIS — J3081 Allergic rhinitis due to animal (cat) (dog) hair and dander: Secondary | ICD-10-CM | POA: Diagnosis not present

## 2017-11-09 DIAGNOSIS — J3089 Other allergic rhinitis: Secondary | ICD-10-CM | POA: Diagnosis not present

## 2017-11-17 ENCOUNTER — Other Ambulatory Visit: Payer: Self-pay | Admitting: Family Medicine

## 2017-11-17 DIAGNOSIS — Z1231 Encounter for screening mammogram for malignant neoplasm of breast: Secondary | ICD-10-CM

## 2017-11-24 DIAGNOSIS — R69 Illness, unspecified: Secondary | ICD-10-CM | POA: Diagnosis not present

## 2017-12-17 ENCOUNTER — Ambulatory Visit
Admission: RE | Admit: 2017-12-17 | Discharge: 2017-12-17 | Disposition: A | Discharge status: Home / Self Care | Payer: Medicare HMO | Source: Ambulatory Visit | Attending: Family Medicine | Admitting: Family Medicine

## 2017-12-17 DIAGNOSIS — Z1231 Encounter for screening mammogram for malignant neoplasm of breast: Secondary | ICD-10-CM | POA: Diagnosis not present

## 2018-02-24 ENCOUNTER — Other Ambulatory Visit: Payer: Self-pay | Admitting: Thoracic Surgery (Cardiothoracic Vascular Surgery)

## 2018-02-24 DIAGNOSIS — C3490 Malignant neoplasm of unspecified part of unspecified bronchus or lung: Secondary | ICD-10-CM

## 2018-02-24 NOTE — Progress Notes (Unsigned)
ct 

## 2018-03-17 DIAGNOSIS — E2839 Other primary ovarian failure: Secondary | ICD-10-CM | POA: Diagnosis not present

## 2018-03-17 DIAGNOSIS — J4540 Moderate persistent asthma, uncomplicated: Secondary | ICD-10-CM | POA: Diagnosis not present

## 2018-03-17 DIAGNOSIS — Z85118 Personal history of other malignant neoplasm of bronchus and lung: Secondary | ICD-10-CM | POA: Diagnosis not present

## 2018-03-17 DIAGNOSIS — I10 Essential (primary) hypertension: Secondary | ICD-10-CM | POA: Diagnosis not present

## 2018-03-17 DIAGNOSIS — E7800 Pure hypercholesterolemia, unspecified: Secondary | ICD-10-CM | POA: Diagnosis not present

## 2018-03-17 DIAGNOSIS — Z79899 Other long term (current) drug therapy: Secondary | ICD-10-CM | POA: Diagnosis not present

## 2018-03-17 DIAGNOSIS — Z8543 Personal history of malignant neoplasm of ovary: Secondary | ICD-10-CM | POA: Diagnosis not present

## 2018-03-17 DIAGNOSIS — Z0001 Encounter for general adult medical examination with abnormal findings: Secondary | ICD-10-CM | POA: Diagnosis not present

## 2018-03-23 ENCOUNTER — Other Ambulatory Visit: Payer: Self-pay | Admitting: Family Medicine

## 2018-03-23 DIAGNOSIS — E2839 Other primary ovarian failure: Secondary | ICD-10-CM

## 2018-03-25 ENCOUNTER — Ambulatory Visit
Admission: RE | Admit: 2018-03-25 | Discharge: 2018-03-25 | Disposition: A | Discharge status: Home / Self Care | Payer: Medicare HMO | Source: Ambulatory Visit | Attending: Thoracic Surgery (Cardiothoracic Vascular Surgery) | Admitting: Thoracic Surgery (Cardiothoracic Vascular Surgery)

## 2018-03-25 DIAGNOSIS — I313 Pericardial effusion (noninflammatory): Secondary | ICD-10-CM | POA: Diagnosis not present

## 2018-03-25 DIAGNOSIS — C3490 Malignant neoplasm of unspecified part of unspecified bronchus or lung: Secondary | ICD-10-CM

## 2018-03-29 ENCOUNTER — Ambulatory Visit: Payer: Medicare HMO | Admitting: Thoracic Surgery (Cardiothoracic Vascular Surgery)

## 2018-03-29 ENCOUNTER — Encounter: Payer: Self-pay | Admitting: Thoracic Surgery (Cardiothoracic Vascular Surgery)

## 2018-03-29 VITALS — BP 110/70 | HR 66 | Resp 20 | Ht 62.0 in

## 2018-03-29 DIAGNOSIS — Z85118 Personal history of other malignant neoplasm of bronchus and lung: Secondary | ICD-10-CM | POA: Diagnosis not present

## 2018-03-29 DIAGNOSIS — Z902 Acquired absence of lung [part of]: Secondary | ICD-10-CM

## 2018-03-29 NOTE — Progress Notes (Signed)
[address]       [place], [address]             [phone]     HPI: Kristy Morrow returns for a 5-year follow-up visit  Kristy Morrow is a 68 year old former smoker (quit in 2006) who had a thoracoscopic right upper lobectomy for a stage Ia non-small cell carcinoma in 2015.  I last saw her in the office a year ago.  She was doing well at that time.  Her CT showed multiple stable small 2 to 3 mm pulmonary nodules.  Over the past year she has been feeling well.  She has not had any significant respiratory issues.  Her appetite is good.  Her weight is stable.  Past Medical History:  Diagnosis Date  . Allergic rhinitis   . COPD (chronic obstructive pulmonary disease) ([place]) sept 2011   seen on CT. Gold stage 0 on PFT Oct 2011  . Non-small cell carcinoma of right lung, stage 1 ([place])    s/p thoracoscopic RUL 02/24/2013  . Ovarian cancer ([place]) aug 2010   s/p TAH BSO. Dr Robert Bellow. Stage 1A low grade cystadenocarinoma  . Pulmonary nodule, right    RUL 8-74mm GGO noted Sept 2011. Stable march 2012  . Unspecified essential hypertension   . Unspecified vitamin D deficiency     Current Outpatient Medications  Medication Sig Dispense Refill  . Albuterol (VENTOLIN IN) Inhale 1 puff into the lungs as needed.    . budesonide-formoterol (SYMBICORT) 80-4.5 MCG/ACT inhaler Inhale 2 puffs into the lungs 2 (two) times daily.    . Cholecalciferol (VITAMIN D) 2000 UNITS CAPS Take 2,000 Units by mouth.    Marland Kitchen ibuprofen (ADVIL,MOTRIN) 200 MG tablet Take 200 mg by mouth every 8 (eight) hours as needed.     Marland Kitchen levocetirizine (XYZAL) 2.5 MG/5ML solution Take 2.5 mg by mouth every evening.    Marland Kitchen losartan (COZAAR) 50 MG tablet Take 50 mg by mouth daily.      . metoprolol (TOPROL-XL) 50 MG 24 hr tablet Take 1 tablet by mouth Daily.     No current facility-administered medications for this visit.     Physical Exam BP 110/70   Pulse 66   Resp 20   Ht 5\' 2"  (1.575 m)   SpO2 98% Comment: RA   BMI 28.35 kg/m  Kristy Morrow is a 68 year old woman in no acute distress Well-developed and nourished Alert and oriented x3 with no focal deficits No cervical or supraclavicular adenopathy Lungs diminished at right base, otherwise clear Cardiac regular rate and rhythm normal S1 and S2  Diagnostic Tests: CT CHEST WITHOUT CONTRAST  TECHNIQUE: Multidetector CT imaging of the chest was performed following the standard protocol without IV contrast.  COMPARISON:  Chest CT 04/13/2017 and 04/14/2016  FINDINGS: Cardiovascular: The heart is normal in size. There is a persistent and slightly larger pericardial effusion which appears simple. Stable mild tortuosity, ectasia and calcification of the thoracic aorta. No definite coronary artery calcifications.  Mediastinum/Nodes: Small scattered mediastinal and hilar lymph nodes but no mass or overt adenopathy. The esophagus is grossly normal.  Lungs/Pleura: Stable surgical changes from a right upper lobe lobectomy. No findings suspicious for recurrent tumor.  Stable pleural and parenchymal scarring changes at the lung apices.  No new worrisome pulmonary lesions. A few small scattered pulmonary nodules are stable and considered benign. A few calcified granulomas are also noted.  No acute pulmonary findings or pleural effusion.  Upper Abdomen:  No significant upper abdominal findings. No adrenal or hepatic lesions are identified without contrast. Stable aortic calcifications.  Musculoskeletal: No breast masses, supraclavicular or axillary adenopathy.  The bony thorax is intact.  No worrisome bone lesions.  IMPRESSION: 1. Stable surgical changes from a right upper lobe lobectomy. No findings for recurrent tumor, adenopathy or metastatic disease. 2. Stable scattered pulmonary nodules, considered benign. 3. No new pulmonary lesions or acute pulmonary findings. 4. Persistent and slightly larger simple appearing  pericardial effusion.  Aortic Atherosclerosis (ICD10-I70.0).   Electronically Signed   By: Marijo Sanes M.D.   On: 03/25/2018 09:01 I personally reviewed the CT images and concur with the findings noted above  Impression: Kristy Morrow is a 68 year old former smoker who had a stage Ia non-small cell carcinoma dissected with a thoracoscopic right upper lobectomy 5 years ago.  She is now 5 years out with no evidence of recurrent disease.  She is considered cured.  She does have multiple small lung nodules.  These are primarily 2 and 3 mm in size.  They have been stable over many years.  She does have a small simple pericardial effusion.  She informs me that she is moving to [place] in about 2 weeks.  I do not think she needs a surgeon or oncologist there at this point being 5 years out from surgery.  She does need to establish with a primary care physician.  Plan: We will be happy to provide any information to her new primary physician in [place] once that is established  Melrose Nakayama, MD [facility] [phone]

## 2018-04-05 ENCOUNTER — Other Ambulatory Visit: Payer: Medicare HMO

## 2018-04-12 ENCOUNTER — Other Ambulatory Visit: Payer: Medicare HMO

## 2018-04-12 ENCOUNTER — Ambulatory Visit
Admission: RE | Admit: 2018-04-12 | Discharge: 2018-04-12 | Disposition: A | Discharge status: Home / Self Care | Payer: Medicare HMO | Source: Ambulatory Visit | Attending: Family Medicine | Admitting: Family Medicine

## 2018-04-12 DIAGNOSIS — M85852 Other specified disorders of bone density and structure, left thigh: Secondary | ICD-10-CM | POA: Diagnosis not present

## 2018-04-12 DIAGNOSIS — E2839 Other primary ovarian failure: Secondary | ICD-10-CM

## 2018-04-12 DIAGNOSIS — Z780 Asymptomatic menopausal state: Secondary | ICD-10-CM | POA: Diagnosis not present
